# Patient Record
Sex: Male | Born: 1935
Health system: Southern US, Community
[De-identification: ages and names within clinical notes are randomized; demographics above are authoritative.]

## PROBLEM LIST (undated history)

## (undated) DIAGNOSIS — Z95828 Presence of other vascular implants and grafts: Secondary | ICD-10-CM

## (undated) DIAGNOSIS — C9110 Chronic lymphocytic leukemia of B-cell type not having achieved remission: Secondary | ICD-10-CM

## (undated) HISTORY — PX: AORTIC VALVE REPLACEMENT: SHX41

## (undated) HISTORY — DX: Chronic lymphocytic leukemia of B-cell type not having achieved remission: C91.10

## (undated) HISTORY — DX: Presence of other vascular implants and grafts: Z95.828

## (undated) HISTORY — PX: APPENDECTOMY: SHX54

---

## 2011-03-17 ENCOUNTER — Ambulatory Visit (HOSPITAL_COMMUNITY): Payer: Self-pay | Admitting: Oncology

## 2011-08-18 DIAGNOSIS — C8589 Other specified types of non-Hodgkin lymphoma, extranodal and solid organ sites: Secondary | ICD-10-CM | POA: Diagnosis not present

## 2011-08-27 DIAGNOSIS — C8589 Other specified types of non-Hodgkin lymphoma, extranodal and solid organ sites: Secondary | ICD-10-CM | POA: Diagnosis not present

## 2011-09-02 ENCOUNTER — Encounter (HOSPITAL_COMMUNITY): Payer: Medicare Other | Attending: Oncology | Admitting: Oncology

## 2011-09-02 ENCOUNTER — Encounter (HOSPITAL_COMMUNITY): Payer: Self-pay | Admitting: Oncology

## 2011-09-02 VITALS — BP 126/84 | HR 85 | Temp 96.5°F | Ht 66.0 in | Wt 180.5 lb

## 2011-09-02 DIAGNOSIS — Z809 Family history of malignant neoplasm, unspecified: Secondary | ICD-10-CM | POA: Diagnosis not present

## 2011-09-02 DIAGNOSIS — C9111 Chronic lymphocytic leukemia of B-cell type in remission: Secondary | ICD-10-CM

## 2011-09-02 DIAGNOSIS — C9110 Chronic lymphocytic leukemia of B-cell type not having achieved remission: Secondary | ICD-10-CM | POA: Insufficient documentation

## 2011-09-02 HISTORY — DX: Chronic lymphocytic leukemia of B-cell type not having achieved remission: C91.10

## 2011-09-02 NOTE — Progress Notes (Signed)
[facility] NEW PATIENT EVALUATION   Name: Jimmy Russell Date: 09/02/2011 MRN: [id number] DOB: 06-12-1920     DIAGNOSIS: CLL with Richter's Transformation, S/P 6 cycles of R-CHOP chemotherapy with a complete remission per PET criteria.    HISTORY OF PRESENT ILLNESS:Jimmy Russell is a 78 y.o. male who is referred to the [facility] by the request of Dr. Sharyne Richters at [facility] medical center to become an established patient at the [facility] for port flushes and local support.  We are certainly glad to due this for Dr. Sharyne Richters and the patient.   Jimmy Russell is an 78 year old gentleman who was diagnosed and treated at [facility] for CLL that underwent a Richter's Transformation.  He was treated with R-CHOP x 6 cycles which was completed just a few weeks ago.  His PET scan following chemotherapy revealed a complete remission.  This elderly gentleman tolerated therapy well according to his own account.   Our role is to be the patient's local support and to flush his port every three months here at the clinic (he will have it flushed at [facility] every 3 months and we will offset this routine).  Therefore, he will have his port flushed every 6 weeks alternating between the [facility] and [facility].   Sadly, the patient's son passed away a few weeks ago from metastatic bladder cancer.  This of course upsets the patient.  The patient was accompanied by his other son, Jimmy Russell, today.     FAMILY HISTORY: family history includes Cancer in his brother and son.   PAST MEDICAL HISTORY:  has a past medical history of CLL (chronic lymphoblastic leukemia).      SOCIAL HISTORY:  does not have a smoking history on file. He does not have any smokeless tobacco history on file.     ALLERGIES: Review of patient's allergies indicates no known allergies.     REVIEW OF SYSTEMS: Patient reports no health concerns.   PHYSICAL EXAM:   height is 5\' 6"  (1.676 m) and weight is 180 lb 8 oz (81.874 kg). His oral temperature is 96.5 F (35.8 C). His blood pressure is 126/84 and his pulse is 85.  General appearance: alert, cooperative, appears stated age and no distress Neck: no adenopathy, no carotid bruit, no JVD, supple, symmetrical, trachea midline and thyroid not enlarged, symmetric, no tenderness/mass/nodules Lymph nodes: Cervical, supraclavicular, and axillary nodes normal. Resp: clear to auscultation bilaterally Back: symmetric, no curvature. ROM normal. No CVA tenderness. Cardio: regular rate and rhythm, S1, S2 normal, 1-2/6 systolic ejection murmur heard best at left sternal border, no click, rub or gallop GI: soft, non-tender; bowel sounds normal; no masses,  no organomegaly Extremities: extremities normal, atraumatic, no cyanosis or edema Neurologic: Alert and oriented X 3, normal strength and tone. Normal symmetric reflexes. Normal coordination and gait     IMPRESSION:  1. CLL with a Richter's Transformation.  S/P 6 cycles of R-CHOP, tolerated well, PET scan following treatment reveals a complete remission.   PLAN:  1. Return in March for port flush 2. Return 3 months later for port flush 3. Will see the patient on a PRN basis 4. We are glad to be this patient's local support.   All questions were answered.  The patient knows to call the clinic with any questions or concerns.  Patient seen by Dr. Glenford Peers as well.  KEFALAS,THOMAS

## 2011-10-08 ENCOUNTER — Encounter (HOSPITAL_COMMUNITY): Payer: Medicare Other | Attending: Oncology

## 2011-10-08 DIAGNOSIS — C9111 Chronic lymphocytic leukemia of B-cell type in remission: Secondary | ICD-10-CM | POA: Diagnosis not present

## 2011-10-08 DIAGNOSIS — Z452 Encounter for adjustment and management of vascular access device: Secondary | ICD-10-CM

## 2011-10-08 MED ORDER — SODIUM CHLORIDE 0.9 % IJ SOLN
10.0000 mL | INTRAMUSCULAR | Status: DC | PRN
  Administered 2011-10-08: 10 mL via INTRAVENOUS
  Filled 2011-10-08: qty 10

## 2011-10-08 MED ORDER — HEPARIN SOD (PORK) LOCK FLUSH 100 UNIT/ML IV SOLN
INTRAVENOUS | Status: AC
  Administered 2011-10-08: 500 [IU] via INTRAVENOUS
  Filled 2011-10-08: qty 5

## 2011-10-08 MED ORDER — HEPARIN SOD (PORK) LOCK FLUSH 100 UNIT/ML IV SOLN
500.0000 [IU] | Freq: Once | INTRAVENOUS | Status: AC
  Administered 2011-10-08: 500 [IU] via INTRAVENOUS
  Filled 2011-10-08: qty 5

## 2011-10-08 MED ORDER — SODIUM CHLORIDE 0.9 % IJ SOLN
INTRAMUSCULAR | Status: AC
  Administered 2011-10-08: 10 mL via INTRAVENOUS
  Filled 2011-10-08: qty 10

## 2011-10-08 NOTE — Progress Notes (Signed)
Jimmy Russell presented for Portacath access and flush. Proper placement of portacath confirmed by CXR. Portacath located right chest wall and dual ports were accessed with  H 20 needle; new needle x each flush.   Good blood return present. Each portacath flushed with 20ml NS and 500U/18ml Heparin and needle removed intact. Procedure without incident. Patient tolerated procedure well.

## 2011-11-18 ENCOUNTER — Encounter (HOSPITAL_COMMUNITY): Payer: Medicare Other | Attending: Oncology

## 2011-11-18 DIAGNOSIS — Z452 Encounter for adjustment and management of vascular access device: Secondary | ICD-10-CM | POA: Diagnosis not present

## 2011-11-18 DIAGNOSIS — C9110 Chronic lymphocytic leukemia of B-cell type not having achieved remission: Secondary | ICD-10-CM | POA: Diagnosis not present

## 2011-11-18 MED ORDER — HEPARIN SOD (PORK) LOCK FLUSH 100 UNIT/ML IV SOLN
INTRAVENOUS | Status: AC
  Filled 2011-11-18: qty 10

## 2011-11-18 MED ORDER — HEPARIN SOD (PORK) LOCK FLUSH 100 UNIT/ML IV SOLN
500.0000 [IU] | Freq: Once | INTRAVENOUS | Status: AC
  Administered 2011-11-18: 500 [IU] via INTRAVENOUS
  Filled 2011-11-18: qty 5

## 2011-11-18 MED ORDER — SODIUM CHLORIDE 0.9 % IJ SOLN
10.0000 mL | INTRAMUSCULAR | Status: DC | PRN
  Administered 2011-11-18: 10 mL via INTRAVENOUS
  Filled 2011-11-18: qty 10

## 2011-11-18 MED ORDER — SODIUM CHLORIDE 0.9 % IJ SOLN
INTRAMUSCULAR | Status: AC
  Filled 2011-11-18: qty 20

## 2011-11-18 NOTE — Progress Notes (Signed)
Double port flushed bilaterally.  Good blood return both sides.

## 2011-11-19 DIAGNOSIS — I2510 Atherosclerotic heart disease of native coronary artery without angina pectoris: Secondary | ICD-10-CM | POA: Diagnosis not present

## 2011-11-19 DIAGNOSIS — N400 Enlarged prostate without lower urinary tract symptoms: Secondary | ICD-10-CM | POA: Diagnosis not present

## 2011-11-19 DIAGNOSIS — M899 Disorder of bone, unspecified: Secondary | ICD-10-CM | POA: Diagnosis not present

## 2011-11-19 DIAGNOSIS — C8583 Other specified types of non-Hodgkin lymphoma, intra-abdominal lymph nodes: Secondary | ICD-10-CM | POA: Diagnosis not present

## 2011-11-19 DIAGNOSIS — C8589 Other specified types of non-Hodgkin lymphoma, extranodal and solid organ sites: Secondary | ICD-10-CM | POA: Diagnosis not present

## 2011-11-19 DIAGNOSIS — I723 Aneurysm of iliac artery: Secondary | ICD-10-CM | POA: Diagnosis not present

## 2011-11-19 DIAGNOSIS — I700 Atherosclerosis of aorta: Secondary | ICD-10-CM | POA: Diagnosis not present

## 2011-11-26 DIAGNOSIS — C8589 Other specified types of non-Hodgkin lymphoma, extranodal and solid organ sites: Secondary | ICD-10-CM | POA: Diagnosis not present

## 2011-11-26 DIAGNOSIS — C9110 Chronic lymphocytic leukemia of B-cell type not having achieved remission: Secondary | ICD-10-CM | POA: Diagnosis not present

## 2011-12-10 DIAGNOSIS — C9110 Chronic lymphocytic leukemia of B-cell type not having achieved remission: Secondary | ICD-10-CM | POA: Diagnosis not present

## 2011-12-30 ENCOUNTER — Encounter (HOSPITAL_COMMUNITY): Payer: Medicare Other | Attending: Oncology

## 2011-12-30 DIAGNOSIS — C9110 Chronic lymphocytic leukemia of B-cell type not having achieved remission: Secondary | ICD-10-CM | POA: Diagnosis not present

## 2011-12-30 DIAGNOSIS — Z452 Encounter for adjustment and management of vascular access device: Secondary | ICD-10-CM

## 2011-12-30 MED ORDER — SODIUM CHLORIDE 0.9 % IJ SOLN
INTRAMUSCULAR | Status: AC
  Filled 2011-12-30: qty 10

## 2011-12-30 MED ORDER — SODIUM CHLORIDE 0.9 % IJ SOLN
10.0000 mL | Freq: Once | INTRAMUSCULAR | Status: AC
  Administered 2011-12-30: 10 mL via INTRAVENOUS
  Filled 2011-12-30: qty 10

## 2011-12-30 MED ORDER — HEPARIN SOD (PORK) LOCK FLUSH 100 UNIT/ML IV SOLN
500.0000 [IU] | Freq: Once | INTRAVENOUS | Status: AC
  Administered 2011-12-30: 500 [IU] via INTRAVENOUS
  Filled 2011-12-30: qty 5

## 2011-12-30 MED ORDER — HEPARIN SOD (PORK) LOCK FLUSH 100 UNIT/ML IV SOLN
INTRAVENOUS | Status: AC
  Filled 2011-12-30: qty 10

## 2011-12-30 NOTE — Progress Notes (Signed)
Jimmy Russell presented for Portacath access and flush. Proper placement of portacath confirmed by CXR. Portacath located rt chest wall accessed with  H 20 needle. Good blood return present. Portacath flushed with 20ml NS and 500U/70ml Heparin and needle removed intact. Procedure without incident. Patient tolerated procedure well.   Double port present both flushed according to above.

## 2012-02-10 ENCOUNTER — Encounter (HOSPITAL_COMMUNITY): Payer: Medicare Other | Attending: Oncology

## 2012-02-10 DIAGNOSIS — Z452 Encounter for adjustment and management of vascular access device: Secondary | ICD-10-CM

## 2012-02-10 DIAGNOSIS — C9110 Chronic lymphocytic leukemia of B-cell type not having achieved remission: Secondary | ICD-10-CM

## 2012-02-10 MED ORDER — SODIUM CHLORIDE 0.9 % IJ SOLN
10.0000 mL | INTRAMUSCULAR | Status: DC | PRN
  Administered 2012-02-10: 10 mL via INTRAVENOUS
  Filled 2012-02-10: qty 10

## 2012-02-10 MED ORDER — HEPARIN SOD (PORK) LOCK FLUSH 100 UNIT/ML IV SOLN
500.0000 [IU] | Freq: Once | INTRAVENOUS | Status: AC
  Administered 2012-02-10: 500 [IU] via INTRAVENOUS
  Filled 2012-02-10: qty 5

## 2012-02-10 NOTE — Progress Notes (Signed)
Double port flushed per protocol with ns 20 ml and heparin 500 units to each side of port.

## 2012-02-22 DIAGNOSIS — C8589 Other specified types of non-Hodgkin lymphoma, extranodal and solid organ sites: Secondary | ICD-10-CM | POA: Diagnosis not present

## 2012-02-25 DIAGNOSIS — C8589 Other specified types of non-Hodgkin lymphoma, extranodal and solid organ sites: Secondary | ICD-10-CM | POA: Diagnosis not present

## 2012-03-23 ENCOUNTER — Encounter (HOSPITAL_COMMUNITY): Payer: Medicare Other | Attending: Oncology

## 2012-03-23 DIAGNOSIS — Z452 Encounter for adjustment and management of vascular access device: Secondary | ICD-10-CM

## 2012-03-23 DIAGNOSIS — C9110 Chronic lymphocytic leukemia of B-cell type not having achieved remission: Secondary | ICD-10-CM

## 2012-03-23 MED ORDER — HEPARIN SOD (PORK) LOCK FLUSH 100 UNIT/ML IV SOLN
500.0000 [IU] | Freq: Once | INTRAVENOUS | Status: AC
  Administered 2012-03-23: 500 [IU] via INTRAVENOUS
  Filled 2012-03-23: qty 5

## 2012-03-23 MED ORDER — SODIUM CHLORIDE 0.9 % IJ SOLN
10.0000 mL | INTRAMUSCULAR | Status: DC | PRN
  Administered 2012-03-23: 10 mL via INTRAVENOUS
  Filled 2012-03-23: qty 10

## 2012-03-23 MED ORDER — SODIUM CHLORIDE 0.9 % IJ SOLN
INTRAMUSCULAR | Status: AC
  Filled 2012-03-23: qty 10

## 2012-03-23 MED ORDER — HEPARIN SOD (PORK) LOCK FLUSH 100 UNIT/ML IV SOLN
INTRAVENOUS | Status: AC
  Filled 2012-03-23: qty 5

## 2012-03-23 NOTE — Progress Notes (Signed)
Pt has double port.  Both sides flushed without difficulty.  Good blood return from  both sides of port.

## 2012-05-04 ENCOUNTER — Encounter (HOSPITAL_COMMUNITY): Payer: Medicare Other | Attending: Oncology

## 2012-05-04 DIAGNOSIS — Z452 Encounter for adjustment and management of vascular access device: Secondary | ICD-10-CM

## 2012-05-04 DIAGNOSIS — C9110 Chronic lymphocytic leukemia of B-cell type not having achieved remission: Secondary | ICD-10-CM | POA: Insufficient documentation

## 2012-05-04 MED ORDER — SODIUM CHLORIDE 0.9 % IJ SOLN
10.0000 mL | INTRAMUSCULAR | Status: DC | PRN
  Administered 2012-05-04: 10 mL via INTRAVENOUS
  Filled 2012-05-04: qty 10

## 2012-05-04 MED ORDER — HEPARIN SOD (PORK) LOCK FLUSH 100 UNIT/ML IV SOLN
INTRAVENOUS | Status: AC
  Filled 2012-05-04: qty 10

## 2012-05-04 MED ORDER — HEPARIN SOD (PORK) LOCK FLUSH 100 UNIT/ML IV SOLN
500.0000 [IU] | Freq: Once | INTRAVENOUS | Status: AC
  Administered 2012-05-04: 500 [IU] via INTRAVENOUS
  Filled 2012-05-04: qty 5

## 2012-05-04 MED ORDER — SODIUM CHLORIDE 0.9 % IJ SOLN
INTRAMUSCULAR | Status: AC
  Filled 2012-05-04: qty 20

## 2012-05-04 NOTE — Progress Notes (Signed)
Tolerated port flush well.  Good blood return. Has dual port. Both sides flushed.

## 2012-05-23 DIAGNOSIS — N400 Enlarged prostate without lower urinary tract symptoms: Secondary | ICD-10-CM | POA: Diagnosis not present

## 2012-05-23 DIAGNOSIS — C8589 Other specified types of non-Hodgkin lymphoma, extranodal and solid organ sites: Secondary | ICD-10-CM | POA: Diagnosis not present

## 2012-05-26 DIAGNOSIS — C8589 Other specified types of non-Hodgkin lymphoma, extranodal and solid organ sites: Secondary | ICD-10-CM | POA: Diagnosis not present

## 2012-05-26 DIAGNOSIS — Z23 Encounter for immunization: Secondary | ICD-10-CM | POA: Diagnosis not present

## 2012-06-15 ENCOUNTER — Encounter (HOSPITAL_COMMUNITY): Payer: Medicare Other | Attending: Oncology

## 2012-06-15 DIAGNOSIS — Z452 Encounter for adjustment and management of vascular access device: Secondary | ICD-10-CM | POA: Diagnosis not present

## 2012-06-15 DIAGNOSIS — C9110 Chronic lymphocytic leukemia of B-cell type not having achieved remission: Secondary | ICD-10-CM | POA: Diagnosis not present

## 2012-06-15 MED ORDER — HEPARIN SOD (PORK) LOCK FLUSH 100 UNIT/ML IV SOLN
500.0000 [IU] | Freq: Once | INTRAVENOUS | Status: AC
  Administered 2012-06-15: 500 [IU] via INTRAVENOUS
  Filled 2012-06-15: qty 5

## 2012-06-15 MED ORDER — HEPARIN SOD (PORK) LOCK FLUSH 100 UNIT/ML IV SOLN
INTRAVENOUS | Status: AC
  Filled 2012-06-15: qty 10

## 2012-06-15 MED ORDER — SODIUM CHLORIDE 0.9 % IJ SOLN
10.0000 mL | INTRAMUSCULAR | Status: DC | PRN
  Administered 2012-06-15: 10 mL via INTRAVENOUS
  Filled 2012-06-15: qty 10

## 2012-06-15 MED ORDER — SODIUM CHLORIDE 0.9 % IJ SOLN
INTRAMUSCULAR | Status: AC
  Filled 2012-06-15: qty 20

## 2012-06-15 NOTE — Progress Notes (Signed)
Tolerated port flush.  Has double port.  Each side flushed with ns and Heparin.

## 2012-08-01 ENCOUNTER — Encounter (HOSPITAL_COMMUNITY): Payer: Medicare Other | Attending: Oncology

## 2012-08-01 DIAGNOSIS — C9110 Chronic lymphocytic leukemia of B-cell type not having achieved remission: Secondary | ICD-10-CM

## 2012-08-01 DIAGNOSIS — Z452 Encounter for adjustment and management of vascular access device: Secondary | ICD-10-CM

## 2012-08-01 MED ORDER — SODIUM CHLORIDE 0.9 % IJ SOLN
INTRAMUSCULAR | Status: AC
  Filled 2012-08-01: qty 20

## 2012-08-01 MED ORDER — SODIUM CHLORIDE 0.9 % IJ SOLN
10.0000 mL | Freq: Once | INTRAMUSCULAR | Status: AC
  Administered 2012-08-01: 10 mL via INTRAVENOUS
  Filled 2012-08-01: qty 10

## 2012-08-01 MED ORDER — HEPARIN SOD (PORK) LOCK FLUSH 100 UNIT/ML IV SOLN
500.0000 [IU] | Freq: Once | INTRAVENOUS | Status: AC
  Administered 2012-08-01: 500 [IU] via INTRAVENOUS
  Filled 2012-08-01: qty 5

## 2012-08-01 MED ORDER — SODIUM CHLORIDE 0.9 % IJ SOLN
10.0000 mL | INTRAMUSCULAR | Status: DC | PRN
  Administered 2012-08-01: 10 mL via INTRAVENOUS
  Filled 2012-08-01: qty 10

## 2012-08-01 MED ORDER — HEPARIN SOD (PORK) LOCK FLUSH 100 UNIT/ML IV SOLN
INTRAVENOUS | Status: AC
  Filled 2012-08-01: qty 10

## 2012-08-01 NOTE — Progress Notes (Signed)
Jimmy Russell presented for Portacath access and flush. Proper placement of portacath confirmed by CXR. Portacath located rt chest wall accessed with  H 20 needle x 2 for dual port Good blood return present. Portacath flushed with 20ml NS and 500U/41ml Heparin and needle removed intact. Procedure without incident. Patient tolerated procedure well.

## 2012-08-23 DIAGNOSIS — I2510 Atherosclerotic heart disease of native coronary artery without angina pectoris: Secondary | ICD-10-CM | POA: Diagnosis not present

## 2012-08-23 DIAGNOSIS — C8589 Other specified types of non-Hodgkin lymphoma, extranodal and solid organ sites: Secondary | ICD-10-CM | POA: Diagnosis not present

## 2012-08-25 DIAGNOSIS — C8589 Other specified types of non-Hodgkin lymphoma, extranodal and solid organ sites: Secondary | ICD-10-CM | POA: Diagnosis not present

## 2012-08-25 DIAGNOSIS — C9110 Chronic lymphocytic leukemia of B-cell type not having achieved remission: Secondary | ICD-10-CM | POA: Diagnosis not present

## 2012-09-12 ENCOUNTER — Encounter (HOSPITAL_COMMUNITY): Payer: Medicare Other | Attending: Oncology

## 2012-09-12 ENCOUNTER — Encounter (HOSPITAL_COMMUNITY): Payer: Self-pay

## 2012-09-12 DIAGNOSIS — C9110 Chronic lymphocytic leukemia of B-cell type not having achieved remission: Secondary | ICD-10-CM

## 2012-09-12 DIAGNOSIS — Z452 Encounter for adjustment and management of vascular access device: Secondary | ICD-10-CM | POA: Diagnosis not present

## 2012-09-12 DIAGNOSIS — Z95828 Presence of other vascular implants and grafts: Secondary | ICD-10-CM

## 2012-09-12 DIAGNOSIS — Z9889 Other specified postprocedural states: Secondary | ICD-10-CM | POA: Insufficient documentation

## 2012-09-12 HISTORY — DX: Presence of other vascular implants and grafts: Z95.828

## 2012-09-12 MED ORDER — HEPARIN SOD (PORK) LOCK FLUSH 100 UNIT/ML IV SOLN
500.0000 [IU] | Freq: Once | INTRAVENOUS | Status: AC
  Administered 2012-09-12: 500 [IU] via INTRAVENOUS
  Filled 2012-09-12: qty 5

## 2012-09-12 MED ORDER — HEPARIN SOD (PORK) LOCK FLUSH 100 UNIT/ML IV SOLN
INTRAVENOUS | Status: AC
  Filled 2012-09-12: qty 10

## 2012-09-12 MED ORDER — SODIUM CHLORIDE 0.9 % IJ SOLN
10.0000 mL | INTRAMUSCULAR | Status: DC | PRN
  Administered 2012-09-12: 10 mL via INTRAVENOUS
  Filled 2012-09-12: qty 10

## 2012-09-12 NOTE — Progress Notes (Signed)
Juline Patch presented for Portacath access and flush. Proper placement of portacath confirmed by CXR. Portacath located right chest wall accessed with  H 20 needle. Good blood return present. Portacath flushed with 20ml NS and 500U/32ml Heparin and needle removed intact. Procedure without incident. Patient tolerated procedure well.   pt has a double port.  Above same for both left and right sides.

## 2012-09-21 DIAGNOSIS — L259 Unspecified contact dermatitis, unspecified cause: Secondary | ICD-10-CM | POA: Diagnosis not present

## 2012-09-21 DIAGNOSIS — C44319 Basal cell carcinoma of skin of other parts of face: Secondary | ICD-10-CM | POA: Diagnosis not present

## 2012-09-21 DIAGNOSIS — D485 Neoplasm of uncertain behavior of skin: Secondary | ICD-10-CM | POA: Diagnosis not present

## 2012-09-21 DIAGNOSIS — L570 Actinic keratosis: Secondary | ICD-10-CM | POA: Diagnosis not present

## 2012-09-21 DIAGNOSIS — D234 Other benign neoplasm of skin of scalp and neck: Secondary | ICD-10-CM | POA: Diagnosis not present

## 2012-10-20 DIAGNOSIS — D485 Neoplasm of uncertain behavior of skin: Secondary | ICD-10-CM | POA: Diagnosis not present

## 2012-10-20 DIAGNOSIS — Z85828 Personal history of other malignant neoplasm of skin: Secondary | ICD-10-CM | POA: Diagnosis not present

## 2012-10-24 ENCOUNTER — Encounter (HOSPITAL_COMMUNITY): Payer: Medicare Other | Attending: Oncology

## 2012-10-24 DIAGNOSIS — C9110 Chronic lymphocytic leukemia of B-cell type not having achieved remission: Secondary | ICD-10-CM | POA: Diagnosis not present

## 2012-10-24 DIAGNOSIS — Z452 Encounter for adjustment and management of vascular access device: Secondary | ICD-10-CM | POA: Diagnosis not present

## 2012-10-24 DIAGNOSIS — Z95828 Presence of other vascular implants and grafts: Secondary | ICD-10-CM

## 2012-10-24 DIAGNOSIS — Z9889 Other specified postprocedural states: Secondary | ICD-10-CM | POA: Insufficient documentation

## 2012-10-24 MED ORDER — HEPARIN SOD (PORK) LOCK FLUSH 100 UNIT/ML IV SOLN
INTRAVENOUS | Status: AC
  Filled 2012-10-24: qty 10

## 2012-10-24 MED ORDER — HEPARIN SOD (PORK) LOCK FLUSH 100 UNIT/ML IV SOLN
500.0000 [IU] | INTRAVENOUS | Status: DC | PRN
  Administered 2012-10-24 (×2): 500 [IU] via INTRAVENOUS
  Filled 2012-10-24: qty 5

## 2012-10-24 MED ORDER — SODIUM CHLORIDE 0.9 % IJ SOLN
20.0000 mL | INTRAMUSCULAR | Status: DC | PRN
  Administered 2012-10-24 (×2): 20 mL via INTRAVENOUS
  Filled 2012-10-24: qty 20

## 2012-10-24 NOTE — Progress Notes (Signed)
Juline Patch presented for Portacath access and flush. Proper placement of portacath confirmed by CXR. Portacath located right chest wall accessed with  H 20 needles, patient has double port and both sides were flushed. No blood return either side but flushes well without pain or discomfort.   Portacath flushed with 20ml NS and 500U/45ml Heparin and needle removed intact on both sides. Procedure without incident. Patient tolerated procedure well.

## 2012-11-22 DIAGNOSIS — C9110 Chronic lymphocytic leukemia of B-cell type not having achieved remission: Secondary | ICD-10-CM | POA: Diagnosis not present

## 2012-11-22 DIAGNOSIS — C8589 Other specified types of non-Hodgkin lymphoma, extranodal and solid organ sites: Secondary | ICD-10-CM | POA: Diagnosis not present

## 2012-11-24 DIAGNOSIS — C8589 Other specified types of non-Hodgkin lymphoma, extranodal and solid organ sites: Secondary | ICD-10-CM | POA: Diagnosis not present

## 2012-11-24 DIAGNOSIS — C9110 Chronic lymphocytic leukemia of B-cell type not having achieved remission: Secondary | ICD-10-CM | POA: Diagnosis not present

## 2012-12-05 ENCOUNTER — Encounter (HOSPITAL_COMMUNITY): Payer: Medicare Other | Attending: Oncology

## 2012-12-05 DIAGNOSIS — Z95828 Presence of other vascular implants and grafts: Secondary | ICD-10-CM

## 2012-12-05 DIAGNOSIS — C9110 Chronic lymphocytic leukemia of B-cell type not having achieved remission: Secondary | ICD-10-CM | POA: Diagnosis not present

## 2012-12-05 DIAGNOSIS — Z452 Encounter for adjustment and management of vascular access device: Secondary | ICD-10-CM

## 2012-12-05 MED ORDER — HEPARIN SOD (PORK) LOCK FLUSH 100 UNIT/ML IV SOLN
500.0000 [IU] | Freq: Once | INTRAVENOUS | Status: AC
  Administered 2012-12-05: 1000 [IU] via INTRAVENOUS
  Filled 2012-12-05: qty 5

## 2012-12-05 MED ORDER — SODIUM CHLORIDE 0.9 % IJ SOLN
10.0000 mL | INTRAMUSCULAR | Status: DC | PRN
  Administered 2012-12-05: 20 mL via INTRAVENOUS
  Filled 2012-12-05: qty 10

## 2012-12-05 MED ORDER — HEPARIN SOD (PORK) LOCK FLUSH 100 UNIT/ML IV SOLN
INTRAVENOUS | Status: AC
  Filled 2012-12-05: qty 10

## 2012-12-05 NOTE — Progress Notes (Signed)
Jimmy Russell presented for Portacath access and flush. Proper placement of portacath confirmed by CXR. Portacath located right  chest wall accessed with  H 20 needle x 2. Good blood return present x 2 lumens. Portacath flushed with 20ml NS and 500U/40ml Heparin and needle removed intact. Process repeated x1 due to double port. Procedure without incident. Patient tolerated procedure well.

## 2013-01-16 ENCOUNTER — Encounter (HOSPITAL_COMMUNITY): Payer: Medicare Other | Attending: Oncology

## 2013-01-16 DIAGNOSIS — C9110 Chronic lymphocytic leukemia of B-cell type not having achieved remission: Secondary | ICD-10-CM | POA: Diagnosis not present

## 2013-01-16 DIAGNOSIS — Z452 Encounter for adjustment and management of vascular access device: Secondary | ICD-10-CM | POA: Diagnosis not present

## 2013-01-16 DIAGNOSIS — Z95828 Presence of other vascular implants and grafts: Secondary | ICD-10-CM

## 2013-01-16 DIAGNOSIS — Z9889 Other specified postprocedural states: Secondary | ICD-10-CM | POA: Insufficient documentation

## 2013-01-16 MED ORDER — SODIUM CHLORIDE 0.9 % IJ SOLN
20.0000 mL | INTRAMUSCULAR | Status: DC | PRN
  Administered 2013-01-16 (×2): 20 mL via INTRAVENOUS
  Filled 2013-01-16: qty 20

## 2013-01-16 MED ORDER — HEPARIN SOD (PORK) LOCK FLUSH 100 UNIT/ML IV SOLN
INTRAVENOUS | Status: AC
  Filled 2013-01-16: qty 10

## 2013-01-16 MED ORDER — HEPARIN SOD (PORK) LOCK FLUSH 100 UNIT/ML IV SOLN
500.0000 [IU] | Freq: Once | INTRAVENOUS | Status: AC
  Administered 2013-01-16: 500 [IU] via INTRAVENOUS
  Filled 2013-01-16: qty 5

## 2013-01-16 NOTE — Progress Notes (Signed)
Jimmy Russell presented for Portacath access and flush. Proper placement of portacath confirmed by CXR. Portacath located right chest wall accessed with  H 20 needle. Good blood return present, both sides.   Portacath flushed with 20ml NS and 500U/31ml Heparin and needle removed intact. Procedure without incident. Patient tolerated procedure well.

## 2013-01-26 DIAGNOSIS — Z85828 Personal history of other malignant neoplasm of skin: Secondary | ICD-10-CM | POA: Diagnosis not present

## 2013-01-26 DIAGNOSIS — L821 Other seborrheic keratosis: Secondary | ICD-10-CM | POA: Diagnosis not present

## 2013-02-21 DIAGNOSIS — C8589 Other specified types of non-Hodgkin lymphoma, extranodal and solid organ sites: Secondary | ICD-10-CM | POA: Diagnosis not present

## 2013-02-21 DIAGNOSIS — C9110 Chronic lymphocytic leukemia of B-cell type not having achieved remission: Secondary | ICD-10-CM | POA: Diagnosis not present

## 2013-02-23 DIAGNOSIS — C8589 Other specified types of non-Hodgkin lymphoma, extranodal and solid organ sites: Secondary | ICD-10-CM | POA: Diagnosis not present

## 2013-02-23 DIAGNOSIS — C9110 Chronic lymphocytic leukemia of B-cell type not having achieved remission: Secondary | ICD-10-CM | POA: Diagnosis not present

## 2013-02-27 ENCOUNTER — Encounter (HOSPITAL_COMMUNITY): Payer: Medicare Other

## 2013-02-28 DIAGNOSIS — C9110 Chronic lymphocytic leukemia of B-cell type not having achieved remission: Secondary | ICD-10-CM | POA: Diagnosis not present

## 2013-02-28 DIAGNOSIS — C8589 Other specified types of non-Hodgkin lymphoma, extranodal and solid organ sites: Secondary | ICD-10-CM | POA: Diagnosis not present

## 2013-04-06 DIAGNOSIS — I82409 Acute embolism and thrombosis of unspecified deep veins of unspecified lower extremity: Secondary | ICD-10-CM | POA: Diagnosis not present

## 2013-04-06 DIAGNOSIS — C8589 Other specified types of non-Hodgkin lymphoma, extranodal and solid organ sites: Secondary | ICD-10-CM | POA: Diagnosis not present

## 2013-05-23 DIAGNOSIS — C9110 Chronic lymphocytic leukemia of B-cell type not having achieved remission: Secondary | ICD-10-CM | POA: Diagnosis not present

## 2013-05-23 DIAGNOSIS — C8589 Other specified types of non-Hodgkin lymphoma, extranodal and solid organ sites: Secondary | ICD-10-CM | POA: Diagnosis not present

## 2013-05-25 DIAGNOSIS — C8589 Other specified types of non-Hodgkin lymphoma, extranodal and solid organ sites: Secondary | ICD-10-CM | POA: Diagnosis not present

## 2013-05-25 DIAGNOSIS — C9110 Chronic lymphocytic leukemia of B-cell type not having achieved remission: Secondary | ICD-10-CM | POA: Diagnosis not present

## 2013-05-25 DIAGNOSIS — Z23 Encounter for immunization: Secondary | ICD-10-CM | POA: Diagnosis not present

## 2013-06-09 DIAGNOSIS — J210 Acute bronchiolitis due to respiratory syncytial virus: Secondary | ICD-10-CM | POA: Diagnosis not present

## 2013-06-09 DIAGNOSIS — J392 Other diseases of pharynx: Secondary | ICD-10-CM | POA: Diagnosis not present

## 2013-06-14 ENCOUNTER — Telehealth (HOSPITAL_COMMUNITY): Payer: Self-pay | Admitting: Oncology

## 2013-06-14 NOTE — Telephone Encounter (Signed)
PC TO PT ADVISING HIM THAT THE BILL WAS AN ERROR AND IT HAS BEEN ADJUSTED OFF OF HIS ACCOUNT

## 2013-08-07 DIAGNOSIS — L820 Inflamed seborrheic keratosis: Secondary | ICD-10-CM | POA: Diagnosis not present

## 2013-08-07 DIAGNOSIS — D235 Other benign neoplasm of skin of trunk: Secondary | ICD-10-CM | POA: Diagnosis not present

## 2013-08-07 DIAGNOSIS — L570 Actinic keratosis: Secondary | ICD-10-CM | POA: Diagnosis not present

## 2013-08-07 DIAGNOSIS — L821 Other seborrheic keratosis: Secondary | ICD-10-CM | POA: Diagnosis not present

## 2013-08-21 DIAGNOSIS — L820 Inflamed seborrheic keratosis: Secondary | ICD-10-CM | POA: Diagnosis not present

## 2013-08-21 DIAGNOSIS — C44319 Basal cell carcinoma of skin of other parts of face: Secondary | ICD-10-CM | POA: Diagnosis not present

## 2013-09-07 DIAGNOSIS — C9110 Chronic lymphocytic leukemia of B-cell type not having achieved remission: Secondary | ICD-10-CM | POA: Diagnosis not present

## 2013-09-07 DIAGNOSIS — C8589 Other specified types of non-Hodgkin lymphoma, extranodal and solid organ sites: Secondary | ICD-10-CM | POA: Diagnosis not present

## 2013-10-26 DIAGNOSIS — L820 Inflamed seborrheic keratosis: Secondary | ICD-10-CM | POA: Diagnosis not present

## 2013-10-26 DIAGNOSIS — L570 Actinic keratosis: Secondary | ICD-10-CM | POA: Diagnosis not present

## 2013-12-07 DIAGNOSIS — L538 Other specified erythematous conditions: Secondary | ICD-10-CM | POA: Diagnosis not present

## 2013-12-07 DIAGNOSIS — L821 Other seborrheic keratosis: Secondary | ICD-10-CM | POA: Diagnosis not present

## 2013-12-07 DIAGNOSIS — L570 Actinic keratosis: Secondary | ICD-10-CM | POA: Diagnosis not present

## 2014-01-11 DIAGNOSIS — L570 Actinic keratosis: Secondary | ICD-10-CM | POA: Diagnosis not present

## 2014-03-08 DIAGNOSIS — C9110 Chronic lymphocytic leukemia of B-cell type not having achieved remission: Secondary | ICD-10-CM | POA: Diagnosis not present

## 2014-03-08 DIAGNOSIS — C8589 Other specified types of non-Hodgkin lymphoma, extranodal and solid organ sites: Secondary | ICD-10-CM | POA: Diagnosis not present

## 2014-04-26 DIAGNOSIS — I781 Nevus, non-neoplastic: Secondary | ICD-10-CM | POA: Diagnosis not present

## 2014-04-26 DIAGNOSIS — L905 Scar conditions and fibrosis of skin: Secondary | ICD-10-CM | POA: Diagnosis not present

## 2014-04-26 DIAGNOSIS — L820 Inflamed seborrheic keratosis: Secondary | ICD-10-CM | POA: Diagnosis not present

## 2014-04-26 DIAGNOSIS — Z85828 Personal history of other malignant neoplasm of skin: Secondary | ICD-10-CM | POA: Diagnosis not present

## 2014-05-07 DIAGNOSIS — Z23 Encounter for immunization: Secondary | ICD-10-CM | POA: Diagnosis not present

## 2014-05-23 DIAGNOSIS — R197 Diarrhea, unspecified: Secondary | ICD-10-CM | POA: Diagnosis not present

## 2014-05-23 DIAGNOSIS — Z79899 Other long term (current) drug therapy: Secondary | ICD-10-CM | POA: Diagnosis not present

## 2014-05-29 DIAGNOSIS — K591 Functional diarrhea: Secondary | ICD-10-CM | POA: Diagnosis not present

## 2014-05-29 DIAGNOSIS — R197 Diarrhea, unspecified: Secondary | ICD-10-CM | POA: Diagnosis not present

## 2014-05-29 DIAGNOSIS — R7301 Impaired fasting glucose: Secondary | ICD-10-CM | POA: Diagnosis not present

## 2014-09-06 DIAGNOSIS — R634 Abnormal weight loss: Secondary | ICD-10-CM | POA: Diagnosis not present

## 2014-09-06 DIAGNOSIS — R160 Hepatomegaly, not elsewhere classified: Secondary | ICD-10-CM | POA: Diagnosis not present

## 2014-09-06 DIAGNOSIS — Z8572 Personal history of non-Hodgkin lymphomas: Secondary | ICD-10-CM | POA: Diagnosis not present

## 2014-09-06 DIAGNOSIS — C8590 Non-Hodgkin lymphoma, unspecified, unspecified site: Secondary | ICD-10-CM | POA: Diagnosis not present

## 2014-09-06 DIAGNOSIS — C9110 Chronic lymphocytic leukemia of B-cell type not having achieved remission: Secondary | ICD-10-CM | POA: Diagnosis not present

## 2014-11-08 ENCOUNTER — Emergency Department (HOSPITAL_COMMUNITY)
Admission: EM | Admit: 2014-11-08 | Discharge: 2014-11-08 | Disposition: A | Discharge status: Home / Self Care | Payer: Medicare Other | Attending: Emergency Medicine | Admitting: Emergency Medicine

## 2014-11-08 ENCOUNTER — Encounter (HOSPITAL_COMMUNITY): Payer: Self-pay | Admitting: Emergency Medicine

## 2014-11-08 ENCOUNTER — Other Ambulatory Visit: Payer: Self-pay

## 2014-11-08 ENCOUNTER — Emergency Department (HOSPITAL_COMMUNITY): Payer: Medicare Other

## 2014-11-08 DIAGNOSIS — R002 Palpitations: Secondary | ICD-10-CM | POA: Diagnosis not present

## 2014-11-08 DIAGNOSIS — R079 Chest pain, unspecified: Secondary | ICD-10-CM | POA: Diagnosis present

## 2014-11-08 DIAGNOSIS — Z7982 Long term (current) use of aspirin: Secondary | ICD-10-CM | POA: Insufficient documentation

## 2014-11-08 DIAGNOSIS — Z79899 Other long term (current) drug therapy: Secondary | ICD-10-CM | POA: Insufficient documentation

## 2014-11-08 DIAGNOSIS — I440 Atrioventricular block, first degree: Secondary | ICD-10-CM | POA: Insufficient documentation

## 2014-11-08 DIAGNOSIS — Z87891 Personal history of nicotine dependence: Secondary | ICD-10-CM | POA: Insufficient documentation

## 2014-11-08 DIAGNOSIS — J449 Chronic obstructive pulmonary disease, unspecified: Secondary | ICD-10-CM | POA: Diagnosis not present

## 2014-11-08 LAB — CBC WITH DIFFERENTIAL/PLATELET
Basophils Absolute: 0 10*3/uL (ref 0.0–0.1)
Basophils Relative: 0 % (ref 0–1)
EOS ABS: 0.1 10*3/uL (ref 0.0–0.7)
EOS PCT: 2 % (ref 0–5)
HCT: 48.6 % (ref 39.0–52.0)
HEMOGLOBIN: 16.8 g/dL (ref 13.0–17.0)
Lymphocytes Relative: 21 % (ref 12–46)
Lymphs Abs: 1.7 10*3/uL (ref 0.7–4.0)
MCH: 31.5 pg (ref 26.0–34.0)
MCHC: 34.6 g/dL (ref 30.0–36.0)
MCV: 91 fL (ref 78.0–100.0)
MONOS PCT: 9 % (ref 3–12)
Monocytes Absolute: 0.7 10*3/uL (ref 0.1–1.0)
Neutro Abs: 5.7 10*3/uL (ref 1.7–7.7)
Neutrophils Relative %: 68 % (ref 43–77)
PLATELETS: 127 10*3/uL — AB (ref 150–400)
RBC: 5.34 MIL/uL (ref 4.22–5.81)
RDW: 13.5 % (ref 11.5–15.5)
WBC: 8.3 10*3/uL (ref 4.0–10.5)

## 2014-11-08 LAB — BASIC METABOLIC PANEL
Anion gap: 9 (ref 5–15)
BUN: 14 mg/dL (ref 6–23)
CHLORIDE: 102 mmol/L (ref 96–112)
CO2: 29 mmol/L (ref 19–32)
Calcium: 9.4 mg/dL (ref 8.4–10.5)
Creatinine, Ser: 0.84 mg/dL (ref 0.50–1.35)
GFR calc Af Amer: 85 mL/min — ABNORMAL LOW (ref >90)
GFR, EST NON AFRICAN AMERICAN: 74 mL/min — AB (ref >90)
Glucose, Bld: 120 mg/dL — ABNORMAL HIGH (ref 70–99)
Potassium: 4.6 mmol/L (ref 3.5–5.1)
SODIUM: 140 mmol/L (ref 135–145)

## 2014-11-08 LAB — URINALYSIS, ROUTINE W REFLEX MICROSCOPIC
BILIRUBIN URINE: NEGATIVE
Glucose, UA: NEGATIVE mg/dL
Hgb urine dipstick: NEGATIVE
KETONES UR: NEGATIVE mg/dL
Leukocytes, UA: NEGATIVE
Nitrite: NEGATIVE
Protein, ur: NEGATIVE mg/dL
Specific Gravity, Urine: 1.015 (ref 1.005–1.030)
UROBILINOGEN UA: 0.2 mg/dL (ref 0.0–1.0)
pH: 6 (ref 5.0–8.0)

## 2014-11-08 LAB — TROPONIN I: Troponin I: <0.03 ng/mL (ref <0.031)

## 2014-11-08 LAB — TSH: TSH: 8.003 u[IU]/mL — ABNORMAL HIGH (ref 0.350–4.500)

## 2014-11-08 LAB — MAGNESIUM: Magnesium: 1.9 mg/dL (ref 1.5–2.5)

## 2014-11-08 NOTE — ED Notes (Signed)
Having pain to left chest since last night.  Non-radiating.  Did c/o lightheadedness.  Does not c/o pain at this time.  Had heart surgery and 2006 in [facility], value replacement.

## 2014-11-08 NOTE — ED Notes (Signed)
EKG completed, given to Dr. Lacinda Axon

## 2014-11-08 NOTE — ED Provider Notes (Addendum)
TIME SEEN: 3:40 PM  CHIEF COMPLAINT: Palpitations  HPI: Pt is a 81 y.o. male with history of CLL who is in remission, aortic valve replacement in September 2006 with a bovine valve who is not on anticoagulation who presents to the emergency department with complaints of feeling a "quivering" in the left side of his chest that started last night intermittently. Family reports he has had some dizziness the patient denies this. No syncopal episodes. Denies any chest pain. No shortness of breath. No nausea, vomiting or diarrhea. No bloody stool or melena. Family states they were concerned because he has not had cardiology follow-up for his aortic valve. They're requesting referral to cardiology. Primary care physician is Dr. Nevada Crane. Denies history of PE or DVT. No lower extremity swelling or pain. No history of recent surgery, fracture, trauma, hospitalization. Patient reports that he is able to walk vigorously 35 minutes a day every other day without ever becoming symptomatic.  ROS: See HPI Constitutional: no fever  Eyes: no drainage  ENT: no runny nose   Cardiovascular:  no chest pain  Resp: no SOB  GI: no vomiting GU: no dysuria Integumentary: no rash  Allergy: no hives  Musculoskeletal: no leg swelling  Neurological: no slurred speech ROS otherwise negative  PAST MEDICAL HISTORY/PAST SURGICAL HISTORY:  Past Medical History  Diagnosis Date  . CLL (chronic lymphoblastic leukemia)   . CLL (chronic lymphocytic leukemia) with Richter's Transformation  09/02/2011  . Port catheter in place 09/12/2012    MEDICATIONS:  Prior to Admission medications   Medication Sig Start Date End Date Taking? Authorizing Provider  aspirin 81 MG tablet Take 160 mg by mouth daily.    Historical Provider, MD  beta carotene w/minerals (OCUVITE) tablet Take 1 tablet by mouth daily.    Historical Provider, MD  sulfamethoxazole-trimethoprim (BACTRIM DS) 800-160 MG per tablet Take 1 tablet by mouth 3 (three) times a  week. One tablet on Mon-Wed-Fri    Historical Provider, MD    ALLERGIES:  No Known Allergies  SOCIAL HISTORY:  History  Substance Use Topics  . Smoking status: Former Research scientist (life sciences)  . Smokeless tobacco: Not on file  . Alcohol Use: Not on file    FAMILY HISTORY: Family History  Problem Relation Age of Onset  . Cancer Brother     prostate cancer  . Cancer Son     bladder cancer    EXAM: BP 137/85 mmHg  Pulse 89  Temp(Src) 97.7 F (36.5 C) (Oral)  Ht 5\' 8"  (1.727 m)  Wt 174 lb (78.926 kg)  BMI 26.46 kg/m2  SpO2 99% CONSTITUTIONAL: Alert and oriented and responds appropriately to questions. Well-appearing; well-nourished HEAD: Normocephalic EYES: Conjunctivae clear, PERRL ENT: normal nose; no rhinorrhea; moist mucous membranes; pharynx without lesions noted NECK: Supple, no meningismus, no LAD  CARD: RRR; S1 and S2 appreciated; no murmurs, no clicks, no rubs, no gallops RESP: Normal chest excursion without splinting or tachypnea; breath sounds clear and equal bilaterally; no wheezes, no rhonchi, no rales, no hypoxia or respiratory distress, speaking full sentences ABD/GI: Normal bowel sounds; non-distended; soft, non-tender, no rebound, no guarding BACK:  The back appears normal and is non-tender to palpation, there is no CVA tenderness EXT: Normal ROM in all joints; non-tender to palpation; no edema; normal capillary refill; no cyanosis    SKIN: Normal color for age and race; warm NEURO: Moves all extremities equally PSYCH: The patient's mood and manner are appropriate. Grooming and personal hygiene are appropriate.  MEDICAL DECISION MAKING: Patient  here with palpitations that are now gone. EKG initially appeared to be accelerated junctional rhythm but no P waves are appreciated. Suspect this is because of his low voltage on EKG. Repeat EKG shows sinus rhythm with first-degree AV block. No other ischemic abnormality or interval changes. Electrolytes normal. Troponin negative. His  TSH is elevated at 8.003. We'll send for free T3 and T4 and have him follow-up with his primary care physician for this. We'll give referral information for both [facility] and regional cardiology. Have discussed with patient and family return precautions. They verbalize understanding and are comfortable with plan.   Family reports patient has an appointment to see Dr. Nevada Crane on April 19.    EKG Interpretation  Date/Time:  Thursday November 08 2014 14:44:23 EDT Ventricular Rate:  91 PR Interval:    QRS Duration: 78 QT Interval:  358 QTC Calculation: 440 R Axis:   11 Text Interpretation:  Accelerated Junctional rhythm Low voltage QRS Abnormal ECG Baseline wander in V1 No old tracing to compare Confirmed by WARD,  DO, KRISTEN [phone]) on 11/08/2014 3:17:33 PM         EKG Interpretation  Date/Time:  Thursday November 08 2014 18:41:59 EDT Ventricular Rate:  77 PR Interval:  385 QRS Duration: 90 QT Interval:  382 QTC Calculation: 432 R Axis:   0 Text Interpretation:  Age not entered, assumed to be  81 years old for purpose of ECG interpretation Sinus rhythm First degree A-V block Low voltage, precordial leads Confirmed by WARD,  DO, KRISTEN [phone]) on 11/08/2014 6:49:17 PM         Delice Bison Ward, DO 11/08/14 [place], DO 11/08/14 [place], DO 11/08/14 1849

## 2014-11-08 NOTE — Discharge Instructions (Signed)
Your thyroid function was abnormal and I recommend close follow-up with your primary care physician. Otherwise your workup today was reassuring. We'll also give a follow up information for cardiology and [place] as well as in [place]. Please call for outpatient follow-up and outpatient echocardiogram. If you develop chest pain, shortness of breath, sudden sweating, feeling like he may pass out or do pass out, please return to the hospital.    Palpitations A palpitation is the feeling that your heartbeat is irregular or is faster than normal. It may feel like your heart is fluttering or skipping a beat. Palpitations are usually not a serious problem. However, in some cases, you may need further medical evaluation. CAUSES  Palpitations can be caused by:  Smoking.  Caffeine or other stimulants, such as diet pills or energy drinks.  Alcohol.  Stress and anxiety.  Strenuous physical activity.  Fatigue.  Certain medicines.  Heart disease, especially if you have a history of irregular heart rhythms (arrhythmias), such as atrial fibrillation, atrial flutter, or supraventricular tachycardia.  An improperly working pacemaker or defibrillator. DIAGNOSIS  To find the cause of your palpitations, your health care provider will take your medical history and perform a physical exam. Your health care provider may also have you take a test called an ambulatory electrocardiogram (ECG). An ECG records your heartbeat patterns over a 24-hour period. You may also have other tests, such as:  Transthoracic echocardiogram (TTE). During echocardiography, sound waves are used to evaluate how blood flows through your heart.  Transesophageal echocardiogram (TEE).  Cardiac monitoring. This allows your health care provider to monitor your heart rate and rhythm in real time.  Holter monitor. This is a portable device that records your heartbeat and can help diagnose heart arrhythmias. It allows your health  care provider to track your heart activity for several days, if needed.  Stress tests by exercise or by giving medicine that makes the heart beat faster. TREATMENT  Treatment of palpitations depends on the cause of your symptoms and can vary greatly. Most cases of palpitations do not require any treatment other than time, relaxation, and monitoring your symptoms. Other causes, such as atrial fibrillation, atrial flutter, or supraventricular tachycardia, usually require further treatment. HOME CARE INSTRUCTIONS   Avoid:  Caffeinated coffee, tea, soft drinks, diet pills, and energy drinks.  Chocolate.  Alcohol.  Stop smoking if you smoke.  Reduce your stress and anxiety. Things that can help you relax include:  A method of controlling things in your body, such as your heartbeats, with your mind (biofeedback).  Yoga.  Meditation.  Physical activity such as swimming, jogging, or walking.  Get plenty of rest and sleep. SEEK MEDICAL CARE IF:   You continue to have a fast or irregular heartbeat beyond 24 hours.  Your palpitations occur more often. SEEK IMMEDIATE MEDICAL CARE IF:  You have chest pain or shortness of breath.  You have a severe headache.  You feel dizzy or you faint. MAKE SURE YOU:  Understand these instructions.  Will watch your condition.  Will get help right away if you are not doing well or get worse. Document Released: 07/17/2000 Document Revised: 07/25/2013 Document Reviewed: 09/18/2011 [facility] Patient Information 2015 [place], [place]. This information is not intended to replace advice given to you by your health care provider. Make sure you discuss any questions you have with your health care provider.

## 2014-11-08 NOTE — ED Notes (Signed)
Patient with no complaints at this time. Respirations even and unlabored. Skin warm/dry. Discharge instructions reviewed with patient at this time. Patient given opportunity to voice concerns/ask questions. Patient discharged at this time and left Emergency Department with steady gait.   

## 2014-11-08 NOTE — ED Notes (Signed)
C/O "rumbling" feeling in L lower chest wall.  Denies any pain, syncope, dizziness.

## 2014-11-09 LAB — T3, FREE: T3 FREE: 2.2 pg/mL (ref 2.0–4.4)

## 2014-11-09 LAB — T4, FREE: Free T4: 0.89 ng/dL (ref 0.80–1.80)

## 2014-11-20 DIAGNOSIS — E785 Hyperlipidemia, unspecified: Secondary | ICD-10-CM | POA: Diagnosis not present

## 2014-11-20 DIAGNOSIS — Z0000 Encounter for general adult medical examination without abnormal findings: Secondary | ICD-10-CM | POA: Diagnosis not present

## 2014-11-20 DIAGNOSIS — R7301 Impaired fasting glucose: Secondary | ICD-10-CM | POA: Diagnosis not present

## 2014-11-22 DIAGNOSIS — D696 Thrombocytopenia, unspecified: Secondary | ICD-10-CM | POA: Diagnosis not present

## 2014-11-22 DIAGNOSIS — R002 Palpitations: Secondary | ICD-10-CM | POA: Diagnosis not present

## 2014-11-22 DIAGNOSIS — C9511 Chronic leukemia of unspecified cell type, in remission: Secondary | ICD-10-CM | POA: Diagnosis not present

## 2014-11-22 DIAGNOSIS — R7301 Impaired fasting glucose: Secondary | ICD-10-CM | POA: Diagnosis not present

## 2014-12-21 ENCOUNTER — Ambulatory Visit (INDEPENDENT_AMBULATORY_CARE_PROVIDER_SITE_OTHER): Payer: Medicare Other | Admitting: Cardiology

## 2014-12-21 ENCOUNTER — Encounter: Payer: Self-pay | Admitting: Cardiology

## 2014-12-21 VITALS — BP 136/70 | HR 72 | Ht 68.0 in | Wt 181.0 lb

## 2014-12-21 DIAGNOSIS — R002 Palpitations: Secondary | ICD-10-CM

## 2014-12-21 DIAGNOSIS — I440 Atrioventricular block, first degree: Secondary | ICD-10-CM | POA: Diagnosis not present

## 2014-12-21 DIAGNOSIS — I350 Nonrheumatic aortic (valve) stenosis: Secondary | ICD-10-CM

## 2014-12-21 NOTE — Patient Instructions (Signed)
Your physician wants you to follow-up in: 6 months with Dr Bryna Colander will receive a reminder letter in the mail two months in advance. If you don't receive a letter, please call our office to schedule the follow-up appointment.   Your physician recommends that you continue on your current medications as directed. Please refer to the Current Medication list given to you today.   Your physician has recommended that you wear a holter monitor. Holter monitors are medical devices that record the heart's electrical activity. Doctors most often use these monitors to diagnose arrhythmias. Arrhythmias are problems with the speed or rhythm of the heartbeat. The monitor is a small, portable device. You can wear one while you do your normal daily activities. This is usually used to diagnose what is causing palpitations/syncope (passing out).   Your physician has requested that you have an echocardiogram. Echocardiography is a painless test that uses sound waves to create images of your heart. It provides your doctor with information about the size and shape of your heart and how well your heart's chambers and valves are working. This procedure takes approximately one hour. There are no restrictions for this procedure.     Thank you for choosing [facility] !

## 2014-12-21 NOTE — Progress Notes (Signed)
Clinical Summary Mr. Parcel is a 81 y.o.male seen today as a new patient for the following medical problems.  1. Palpitaitons - seen in ER 11/08/14 with palpitations - TSH 8, free T4 0.89, Mg 1.9, trop neg x 1, K 4.6, Cr 0.84, Hgb 16.8,  - CXR no acute process. EKG SR with long PR interval - no recurrent palpitations but does report episodes of intermittent dizziness that is not positional  2. Aortic stenosis - previous cardiologist in [facility] - hx of AVR sept 2006 with bovine tissue valve at [facility]. 21 mm Edwards tissue valve.  - Denies any DOE, no LE edema. No chest pain. No orthopnea.  Past Medical History  Diagnosis Date  . CLL (chronic lymphoblastic leukemia)   . CLL (chronic lymphocytic leukemia) with Richter's Transformation  09/02/2011  . Port catheter in place 09/12/2012     No Known Allergies   Current Outpatient Prescriptions  Medication Sig Dispense Refill  . aspirin EC 81 MG tablet Take 81 mg by mouth daily.    Marland Kitchen ENSURE (ENSURE) Take 237 mLs by mouth daily.    . Hyprom-Naphaz-Polysorb-Zn Sulf (CLEAR EYES COMPLETE) SOLN Apply 1 drop to eye every morning.    . Multiple Vitamins-Minerals (CENTRUM SILVER ADULT 50+ PO) Take 1 tablet by mouth daily.     No current facility-administered medications for this visit.     Past Surgical History  Procedure Laterality Date  . Appendectomy    . Aortic valve replacement       No Known Allergies    Family History  Problem Relation Age of Onset  . Cancer Brother     prostate cancer  . Cancer Son     bladder cancer     Social History Mr. Bontrager reports that he has quit smoking. He does not have any smokeless tobacco history on file. Mr. Hendershott has no alcohol history on file.   Review of Systems CONSTITUTIONAL: No weight loss, fever, chills, weakness or fatigue.  HEENT: Eyes: No visual loss, blurred vision, double vision or yellow sclerae.No hearing loss, sneezing, congestion,  runny nose or sore throat.  SKIN: No rash or itching.  CARDIOVASCULAR: per HPI RESPIRATORY: No shortness of breath, cough or sputum.  GASTROINTESTINAL: No anorexia, nausea, vomiting or diarrhea. No abdominal pain or blood.  GENITOURINARY: No burning on urination, no polyuria NEUROLOGICAL: +dizziness MUSCULOSKELETAL: No muscle, back pain, joint pain or stiffness.  LYMPHATICS: No enlarged nodes. No history of splenectomy.  PSYCHIATRIC: No history of depression or anxiety.  ENDOCRINOLOGIC: No reports of sweating, cold or heat intolerance. No polyuria or polydipsia.  Marland Kitchen   Physical Examination Filed Vitals:   12/21/14 0917  BP: 136/70  Pulse: 72   Filed Weights   12/21/14 0917  Weight: 181 lb (82.101 kg)    Gen: resting comfortably, no acute distress HEENT: no scleral icterus, pupils equal round and reactive, no palptable cervical adenopathy,  CV: RRR, 2/6 systolic murmur RUSB, no JVD Resp: Clear to auscultation bilaterally GI: abdomen is soft, non-tender, non-distended, normal bowel sounds, no hepatosplenomegaly MSK: extremities are warm, no edema.  Skin: warm, no rash Neuro:  no focal deficits Psych: appropriate affect    Assessment and Plan  1. Palpitations - isolated episode, no recurrence - he does report intermittent episodes of dizziness. With his very long 1st degree AV block concern would be if episodes could be bradycardia related. Will obtain 48 hr monitor  2. Aortic stenosis - previous tissue AVR  in 2006 - with episodes of dizziness will obtain echo   F/u 6 months   Arnoldo Lenis, M.D.

## 2014-12-25 ENCOUNTER — Ambulatory Visit (HOSPITAL_COMMUNITY)
Admission: RE | Admit: 2014-12-25 | Discharge: 2014-12-25 | Disposition: A | Discharge status: Home / Self Care | Payer: Medicare Other | Source: Ambulatory Visit | Attending: Cardiology | Admitting: Cardiology

## 2014-12-25 ENCOUNTER — Ambulatory Visit (INDEPENDENT_AMBULATORY_CARE_PROVIDER_SITE_OTHER)
Admission: RE | Admit: 2014-12-25 | Discharge: 2014-12-25 | Disposition: A | Discharge status: Home / Self Care | Payer: Medicare Other | Source: Ambulatory Visit | Attending: Cardiology | Admitting: Cardiology

## 2014-12-25 DIAGNOSIS — I350 Nonrheumatic aortic (valve) stenosis: Secondary | ICD-10-CM | POA: Insufficient documentation

## 2014-12-25 DIAGNOSIS — R002 Palpitations: Secondary | ICD-10-CM | POA: Diagnosis not present

## 2015-01-10 DIAGNOSIS — Z7982 Long term (current) use of aspirin: Secondary | ICD-10-CM | POA: Diagnosis not present

## 2015-01-10 DIAGNOSIS — Z9889 Other specified postprocedural states: Secondary | ICD-10-CM | POA: Diagnosis not present

## 2015-01-10 DIAGNOSIS — C8590 Non-Hodgkin lymphoma, unspecified, unspecified site: Secondary | ICD-10-CM | POA: Diagnosis not present

## 2015-01-10 DIAGNOSIS — R17 Unspecified jaundice: Secondary | ICD-10-CM | POA: Diagnosis not present

## 2015-01-10 DIAGNOSIS — R42 Dizziness and giddiness: Secondary | ICD-10-CM | POA: Diagnosis not present

## 2015-01-10 DIAGNOSIS — R634 Abnormal weight loss: Secondary | ICD-10-CM | POA: Diagnosis not present

## 2015-01-10 DIAGNOSIS — C9110 Chronic lymphocytic leukemia of B-cell type not having achieved remission: Secondary | ICD-10-CM | POA: Diagnosis not present

## 2015-01-17 DIAGNOSIS — H8110 Benign paroxysmal vertigo, unspecified ear: Secondary | ICD-10-CM | POA: Diagnosis not present

## 2015-01-21 DIAGNOSIS — C44729 Squamous cell carcinoma of skin of left lower limb, including hip: Secondary | ICD-10-CM | POA: Diagnosis not present

## 2015-01-21 DIAGNOSIS — D225 Melanocytic nevi of trunk: Secondary | ICD-10-CM | POA: Diagnosis not present

## 2015-01-21 DIAGNOSIS — X32XXXD Exposure to sunlight, subsequent encounter: Secondary | ICD-10-CM | POA: Diagnosis not present

## 2015-01-21 DIAGNOSIS — L570 Actinic keratosis: Secondary | ICD-10-CM | POA: Diagnosis not present

## 2015-02-20 DIAGNOSIS — Z23 Encounter for immunization: Secondary | ICD-10-CM | POA: Diagnosis not present

## 2015-03-14 DIAGNOSIS — Z08 Encounter for follow-up examination after completed treatment for malignant neoplasm: Secondary | ICD-10-CM | POA: Diagnosis not present

## 2015-03-14 DIAGNOSIS — L570 Actinic keratosis: Secondary | ICD-10-CM | POA: Diagnosis not present

## 2015-03-14 DIAGNOSIS — Z85828 Personal history of other malignant neoplasm of skin: Secondary | ICD-10-CM | POA: Diagnosis not present

## 2015-03-14 DIAGNOSIS — L821 Other seborrheic keratosis: Secondary | ICD-10-CM | POA: Diagnosis not present

## 2015-03-14 DIAGNOSIS — X32XXXD Exposure to sunlight, subsequent encounter: Secondary | ICD-10-CM | POA: Diagnosis not present

## 2015-06-03 DIAGNOSIS — D696 Thrombocytopenia, unspecified: Secondary | ICD-10-CM | POA: Diagnosis not present

## 2015-06-03 DIAGNOSIS — R7301 Impaired fasting glucose: Secondary | ICD-10-CM | POA: Diagnosis not present

## 2015-06-03 DIAGNOSIS — E782 Mixed hyperlipidemia: Secondary | ICD-10-CM | POA: Diagnosis not present

## 2015-06-05 DIAGNOSIS — C9511 Chronic leukemia of unspecified cell type, in remission: Secondary | ICD-10-CM | POA: Diagnosis not present

## 2015-06-05 DIAGNOSIS — Z23 Encounter for immunization: Secondary | ICD-10-CM | POA: Diagnosis not present

## 2015-06-05 DIAGNOSIS — Z953 Presence of xenogenic heart valve: Secondary | ICD-10-CM | POA: Diagnosis not present

## 2015-06-05 DIAGNOSIS — R7301 Impaired fasting glucose: Secondary | ICD-10-CM | POA: Diagnosis not present

## 2015-06-05 DIAGNOSIS — D696 Thrombocytopenia, unspecified: Secondary | ICD-10-CM | POA: Diagnosis not present

## 2015-06-05 IMAGING — DX DG CHEST 2V
2 series · 2 of 2 positions shown · non-contrast
Comparison: 06/09/2013

CLINICAL DATA: LEFT chest pain since last night, nonradiating,
lightheadedness, history CLL, former smoker

EXAM:
CHEST  2 VIEW

[chest pa]
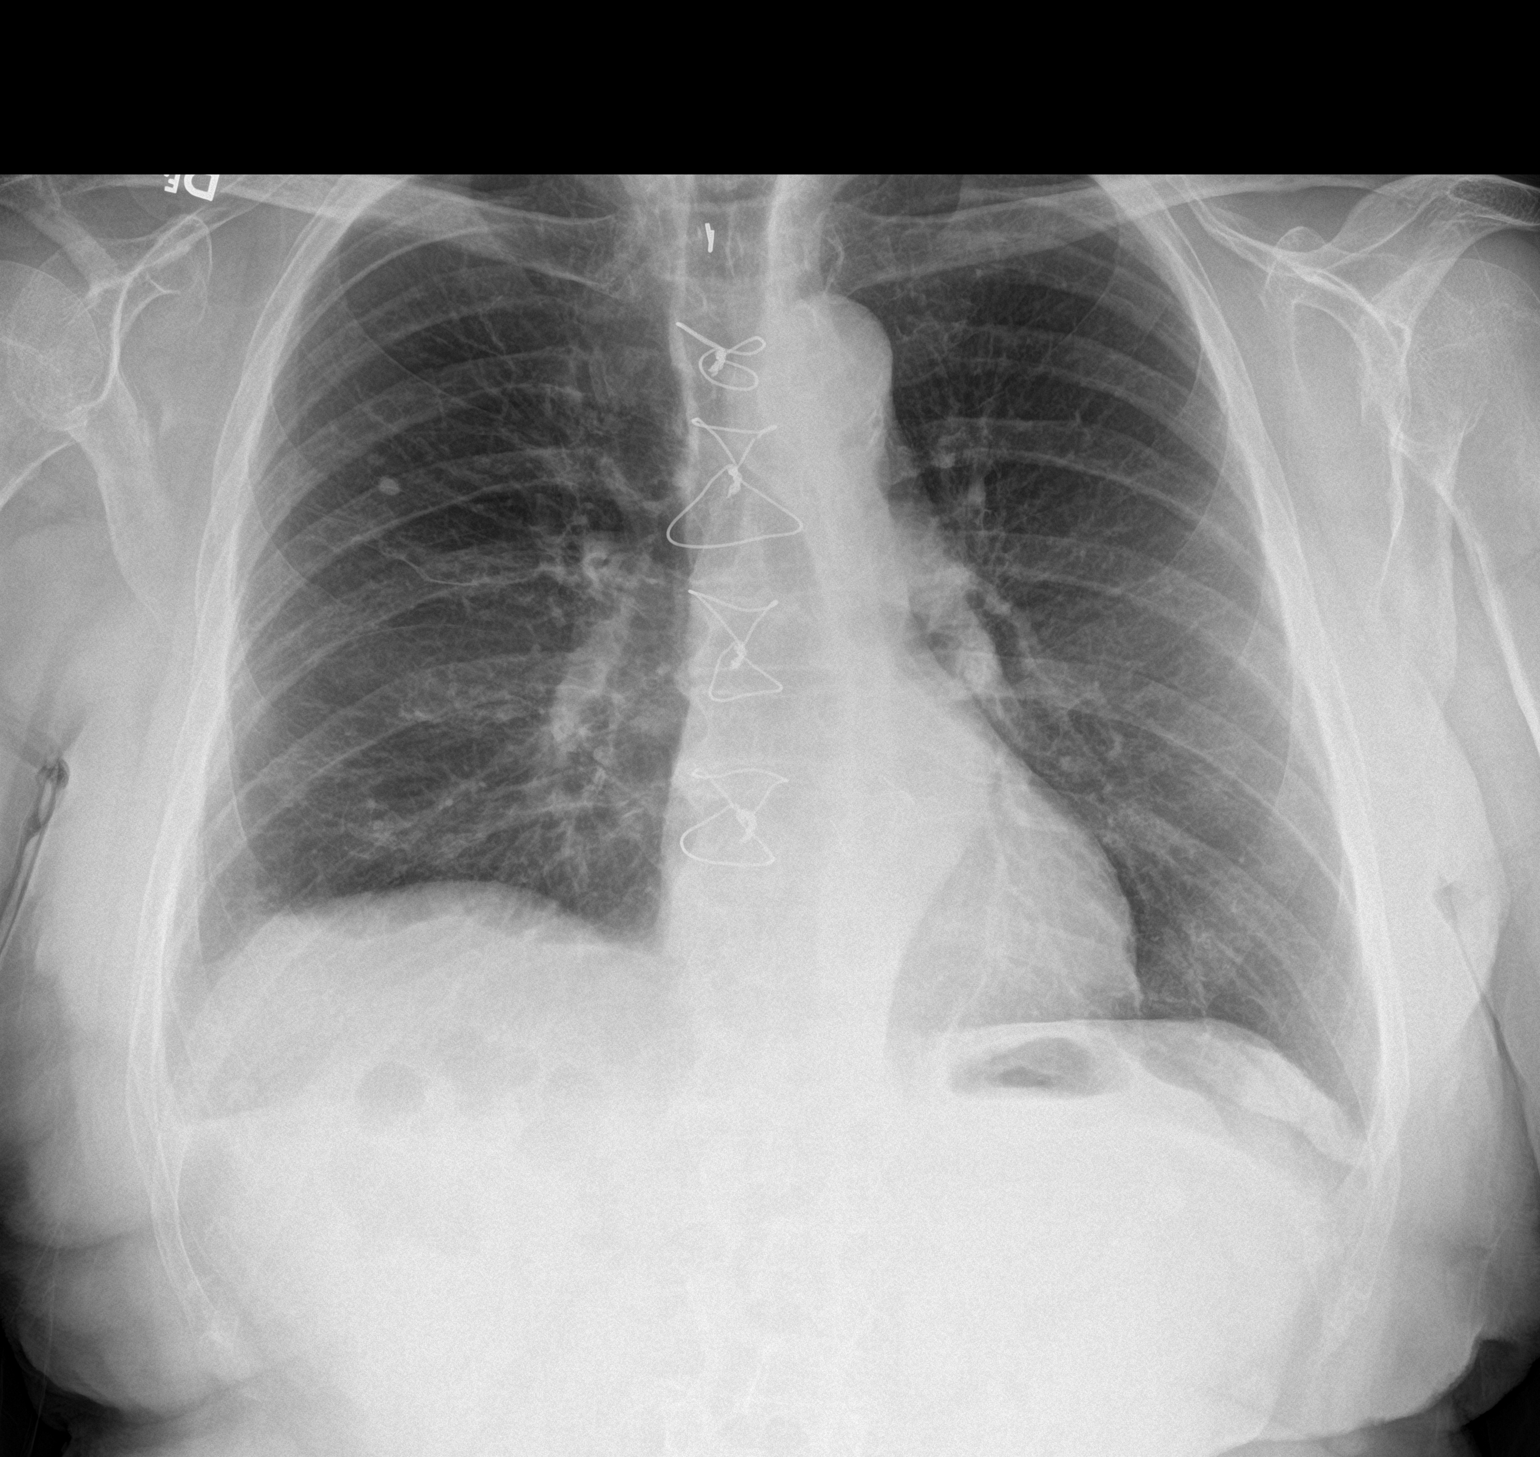

[chest lat]
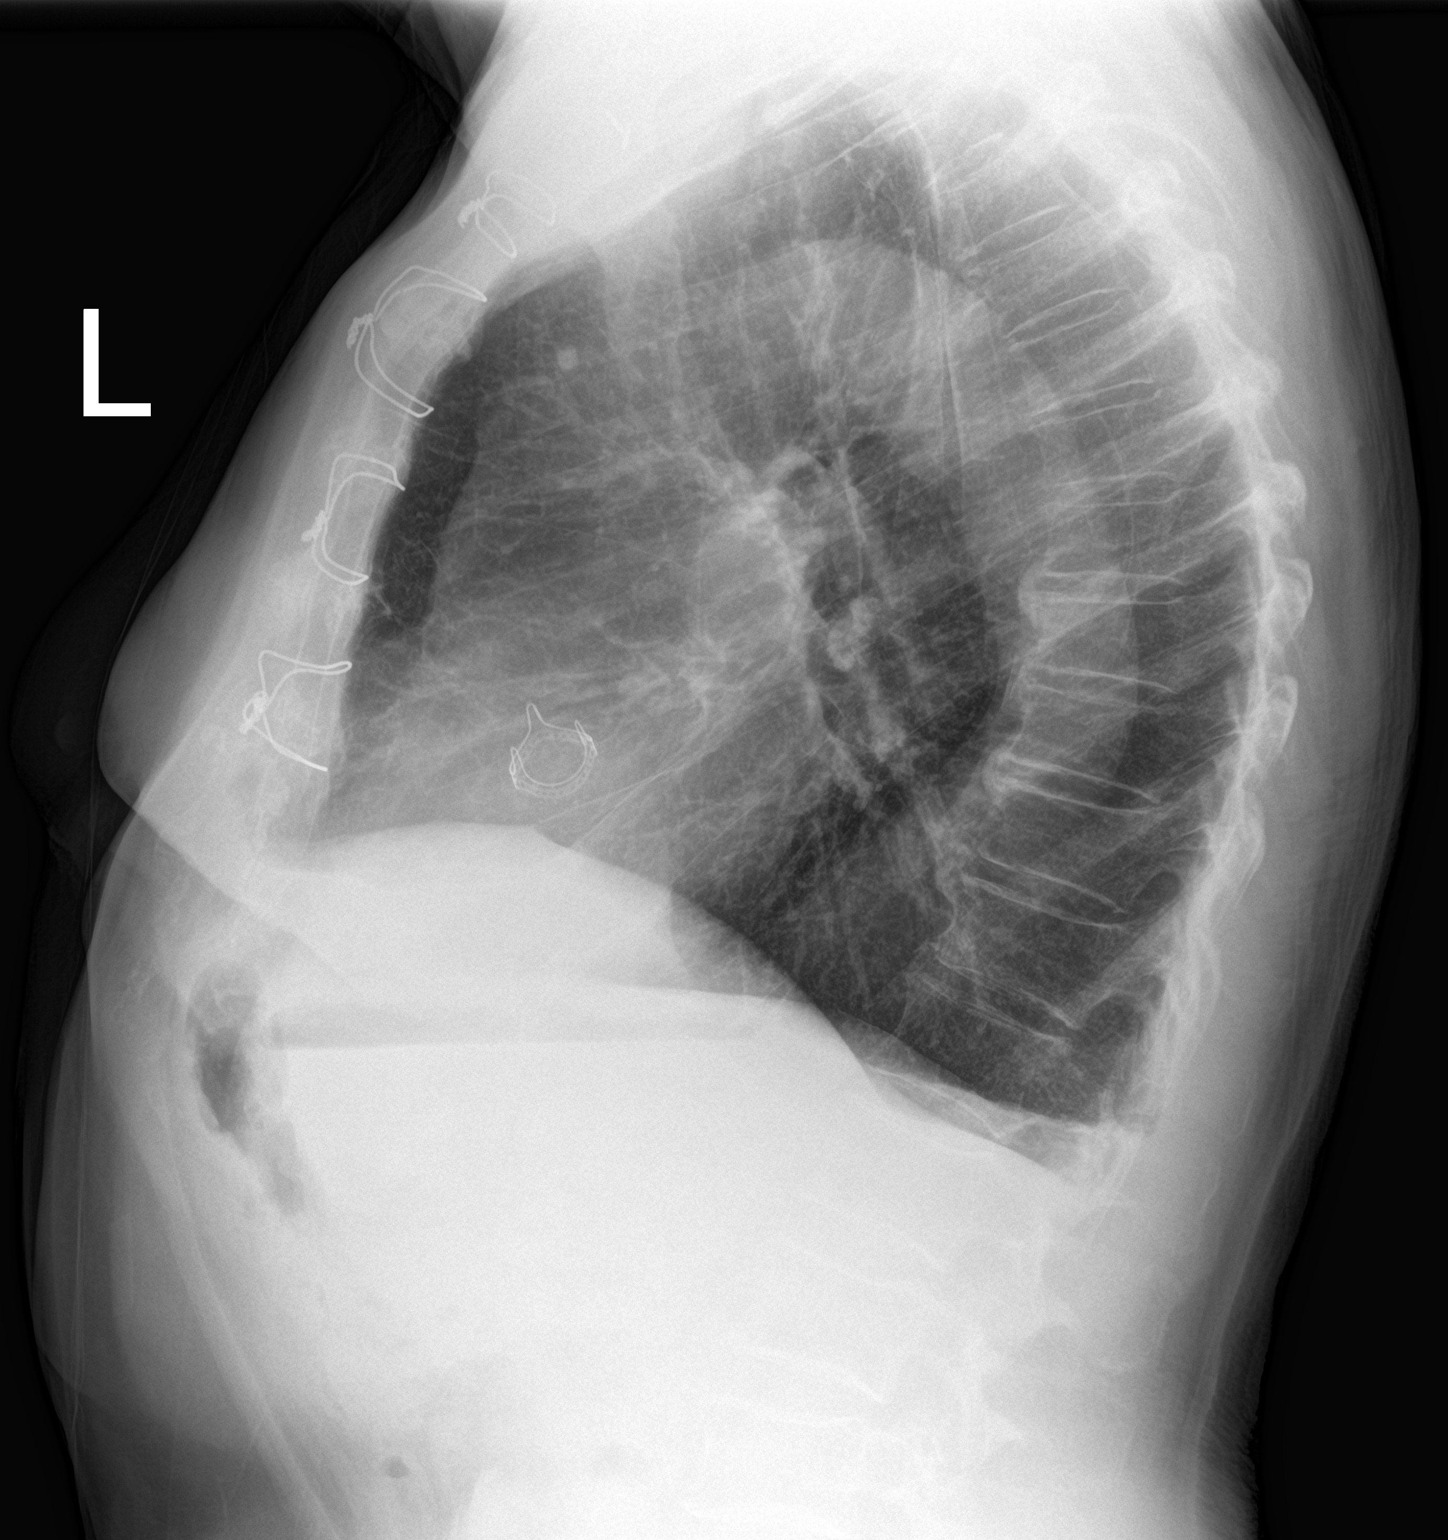

[2 of 2 positions shown; findings below may reference images not displayed]

FINDINGS: Normal heart size post median sternotomy and AVR.

Tortuous aorta with atherosclerotic calcification.

Mediastinal contours and pulmonary vascularity otherwise normal.

Calcified granuloma RIGHT upper lobe.

Hyperinflation and minimal central peribronchial thickening
suggesting COPD.

No acute infiltrate, pleural effusion or pneumothorax.

Bones demineralized.
IMPRESSION: Post AVR.

Question COPD.

No acute abnormalities.

## 2015-06-20 DIAGNOSIS — Z85828 Personal history of other malignant neoplasm of skin: Secondary | ICD-10-CM | POA: Diagnosis not present

## 2015-06-20 DIAGNOSIS — Z08 Encounter for follow-up examination after completed treatment for malignant neoplasm: Secondary | ICD-10-CM | POA: Diagnosis not present

## 2015-07-11 DIAGNOSIS — Z953 Presence of xenogenic heart valve: Secondary | ICD-10-CM | POA: Diagnosis not present

## 2015-07-11 DIAGNOSIS — R634 Abnormal weight loss: Secondary | ICD-10-CM | POA: Diagnosis not present

## 2015-07-11 DIAGNOSIS — C9110 Chronic lymphocytic leukemia of B-cell type not having achieved remission: Secondary | ICD-10-CM | POA: Diagnosis not present

## 2015-07-11 DIAGNOSIS — Z7982 Long term (current) use of aspirin: Secondary | ICD-10-CM | POA: Diagnosis not present

## 2015-07-11 DIAGNOSIS — R42 Dizziness and giddiness: Secondary | ICD-10-CM | POA: Diagnosis not present

## 2015-07-31 ENCOUNTER — Encounter: Payer: Self-pay | Admitting: Cardiology

## 2015-07-31 ENCOUNTER — Ambulatory Visit (INDEPENDENT_AMBULATORY_CARE_PROVIDER_SITE_OTHER): Payer: Medicare Other | Admitting: Cardiology

## 2015-07-31 VITALS — BP 128/80 | HR 97 | Ht 69.0 in | Wt 187.0 lb

## 2015-07-31 DIAGNOSIS — I350 Nonrheumatic aortic (valve) stenosis: Secondary | ICD-10-CM | POA: Diagnosis not present

## 2015-07-31 DIAGNOSIS — R002 Palpitations: Secondary | ICD-10-CM | POA: Diagnosis not present

## 2015-07-31 NOTE — Patient Instructions (Signed)
Your physician wants you to follow-up in: 1 Year with Dr. Branch. You will receive a reminder letter in the mail two months in advance. If you don't receive a letter, please call our office to schedule the follow-up appointment.   Your physician recommends that you continue on your current medications as directed. Please refer to the Current Medication list given to you today.  If you need a refill on your cardiac medications before your next appointment, please call your pharmacy.  Thank you for choosing [facility]!   

## 2015-07-31 NOTE — Progress Notes (Signed)
Patient ID: Raeshon Gantt, male   DOB: [birth year], 81 y.o.   MRN: [id number]     Clinical Summary Mr. Fjerstad is a 81 y.o.male seen today for follow up of the following medical problem.s   1. Palpitaitons - seen in ER 11/08/14 with palpitations - TSH 8, free T4 0.89, Mg 1.9, trop neg x 1, K 4.6, Cr 0.84, Hgb 16.8,  - CXR no acute process. EKG SR with long PR interval  - denies any recent symptoms.   2. Aortic stenosis - previous cardiologist in [facility] - hx of AVR sept 2006 with bovine tissue valve at [facility]. 21 mm Edwards tissue valve.  - echo 12/2014 showed normal bioprosthetic AV function  - walks at [facility] regularly and [employer] for 30 minutes without troubles. Denies any SOB or DOE, no chest pain.   3. CLL - followed by [facility]   Past Medical History  Diagnosis Date  . CLL (chronic lymphoblastic leukemia)   . CLL (chronic lymphocytic leukemia) with Richter's Transformation  09/02/2011  . Port catheter in place 09/12/2012     No Known Allergies   Current Outpatient Prescriptions  Medication Sig Dispense Refill  . aspirin EC 81 MG tablet Take 81 mg by mouth daily.    Marland Kitchen ENSURE (ENSURE) Take 237 mLs by mouth daily.    . Hyprom-Naphaz-Polysorb-Zn Sulf (CLEAR EYES COMPLETE) SOLN Apply 1 drop to eye every morning.    . Multiple Vitamins-Minerals (CENTRUM SILVER ADULT 50+ PO) Take 1 tablet by mouth daily.     No current facility-administered medications for this visit.     Past Surgical History  Procedure Laterality Date  . Appendectomy    . Aortic valve replacement       No Known Allergies    Family History  Problem Relation Age of Onset  . Cancer Brother     prostate cancer  . Cancer Son     bladder cancer     Social History Mr. Berman reports that he has quit smoking. He does not have any smokeless tobacco history on file. Mr. Culverhouse has no alcohol history on file.   Review of Systems CONSTITUTIONAL: No weight  loss, fever, chills, weakness or fatigue.  HEENT: Eyes: No visual loss, blurred vision, double vision or yellow sclerae.No hearing loss, sneezing, congestion, runny nose or sore throat.  SKIN: No rash or itching.  CARDIOVASCULAR: per HPI RESPIRATORY: No shortness of breath, cough or sputum.  GASTROINTESTINAL: No anorexia, nausea, vomiting or diarrhea. No abdominal pain or blood.  GENITOURINARY: No burning on urination, no polyuria NEUROLOGICAL: No headache, dizziness, syncope, paralysis, ataxia, numbness or tingling in the extremities. No change in bowel or bladder control.  MUSCULOSKELETAL: No muscle, back pain, joint pain or stiffness.  LYMPHATICS: No enlarged nodes. No history of splenectomy.  PSYCHIATRIC: No history of depression or anxiety.  ENDOCRINOLOGIC: No reports of sweating, cold or heat intolerance. No polyuria or polydipsia.  Marland Kitchen   Physical Examination Filed Vitals:   07/31/15 1536  BP: 128/80  Pulse: 97   Filed Vitals:   07/31/15 1536  Height: 5\' 9"  (1.753 m)  Weight: 187 lb (84.823 kg)    Gen: resting comfortably, no acute distress HEENT: no scleral icterus, pupils equal round and reactive, no palptable cervical adenopathy,  CV: RRR, 2/6 systolic murmur RUSB, no JVD Resp: Clear to auscultation bilaterally GI: abdomen is soft, non-tender, non-distended, normal bowel sounds, no hepatosplenomegaly MSK: extremities are warm, no edema.  Skin: warm, no rash  Neuro:  no focal deficits Psych: appropriate affect   Diagnostic Studies 12/2014 echo  Study Conclusions  - Left ventricle: The cavity size was normal. Wall thickness was normal. Systolic function was vigorous. The estimated ejection fraction was in the range of 65% to 70%. Wall motion was normal; there were no regional wall motion abnormalities. Doppler parameters are consistent with abnormal left ventricular relaxation (grade 1 diastolic dysfunction). - Aortic valve: A bioprosthesis was present -  Edwards 21 mm. There was no significant regurgitation. Mean gradient (S): 11 mm Hg. Peak gradient (S): 21 mm Hg. VTI ratio of LVOT to aortic valve: 0.51. - Mitral valve: Mildly thickened leaflets . Prolapse, involving the anterior leaflet and the posterior leaflet. There was mild regurgitation. - Left atrium: The atrium was mildly dilated. - Right atrium: Central venous pressure (est): 3 mm Hg. - Atrial septum: No defect or patent foramen ovale was identified. - Tricuspid valve: There was trivial regurgitation. - Pulmonary arteries: PA peak pressure: 31 mm Hg (S). - Pericardium, extracardiac: There was no pericardial effusion.  Impressions:  - Normal LV wall thickness with LVEF Q000111Q, grade 1 diastolic dysfunction. Mild left atrial enlargement. Mild bileaflet mitral prolapse with mild mitral regurgitation. Grossly normal bioprosthetic aortic valve without obvious perivalvular leak and mean gradient 11 mmHg - no prior study for comparison. Trivial tricuspid regurgitation with PASP 31 mmHg.  12/2014 holter monitor  Sinus rhythm with frequent PVC's and occasional PAC's.  No sustained arrhythmias.    Assessment and Plan   1. Palpitations - isolated episode, no recurrence - previous montior without significant arrhythmias - continue to monitor   2. Aortic stenosis - previous tissue AVR in 2006 - AVR with normal function by recent echo, no current symptoms - continue to monitor   F/u 1 year  Arnoldo Lenis, M.D.

## 2015-08-28 DIAGNOSIS — J00 Acute nasopharyngitis [common cold]: Secondary | ICD-10-CM | POA: Diagnosis not present

## 2015-08-28 DIAGNOSIS — R05 Cough: Secondary | ICD-10-CM | POA: Diagnosis not present

## 2015-10-31 DIAGNOSIS — B079 Viral wart, unspecified: Secondary | ICD-10-CM | POA: Diagnosis not present

## 2015-10-31 DIAGNOSIS — L820 Inflamed seborrheic keratosis: Secondary | ICD-10-CM | POA: Diagnosis not present

## 2015-12-16 DIAGNOSIS — R7301 Impaired fasting glucose: Secondary | ICD-10-CM | POA: Diagnosis not present

## 2015-12-16 DIAGNOSIS — E782 Mixed hyperlipidemia: Secondary | ICD-10-CM | POA: Diagnosis not present

## 2015-12-18 DIAGNOSIS — R7301 Impaired fasting glucose: Secondary | ICD-10-CM | POA: Diagnosis not present

## 2015-12-18 DIAGNOSIS — C9511 Chronic leukemia of unspecified cell type, in remission: Secondary | ICD-10-CM | POA: Diagnosis not present

## 2015-12-18 DIAGNOSIS — D0472 Carcinoma in situ of skin of left lower limb, including hip: Secondary | ICD-10-CM | POA: Diagnosis not present

## 2015-12-18 DIAGNOSIS — D696 Thrombocytopenia, unspecified: Secondary | ICD-10-CM | POA: Diagnosis not present

## 2015-12-18 DIAGNOSIS — R42 Dizziness and giddiness: Secondary | ICD-10-CM | POA: Diagnosis not present

## 2015-12-18 DIAGNOSIS — Z953 Presence of xenogenic heart valve: Secondary | ICD-10-CM | POA: Diagnosis not present

## 2016-01-09 DIAGNOSIS — R634 Abnormal weight loss: Secondary | ICD-10-CM | POA: Diagnosis not present

## 2016-01-09 DIAGNOSIS — Z7982 Long term (current) use of aspirin: Secondary | ICD-10-CM | POA: Diagnosis not present

## 2016-01-09 DIAGNOSIS — D696 Thrombocytopenia, unspecified: Secondary | ICD-10-CM | POA: Diagnosis not present

## 2016-01-09 DIAGNOSIS — Z8679 Personal history of other diseases of the circulatory system: Secondary | ICD-10-CM | POA: Diagnosis not present

## 2016-01-09 DIAGNOSIS — C9111 Chronic lymphocytic leukemia of B-cell type in remission: Secondary | ICD-10-CM | POA: Diagnosis not present

## 2016-01-09 DIAGNOSIS — C9110 Chronic lymphocytic leukemia of B-cell type not having achieved remission: Secondary | ICD-10-CM | POA: Diagnosis not present

## 2016-01-09 DIAGNOSIS — R42 Dizziness and giddiness: Secondary | ICD-10-CM | POA: Diagnosis not present

## 2016-01-09 DIAGNOSIS — R17 Unspecified jaundice: Secondary | ICD-10-CM | POA: Diagnosis not present

## 2016-05-18 DIAGNOSIS — Z23 Encounter for immunization: Secondary | ICD-10-CM | POA: Diagnosis not present

## 2016-06-23 DIAGNOSIS — E785 Hyperlipidemia, unspecified: Secondary | ICD-10-CM | POA: Diagnosis not present

## 2016-06-23 DIAGNOSIS — E039 Hypothyroidism, unspecified: Secondary | ICD-10-CM | POA: Diagnosis not present

## 2016-06-23 DIAGNOSIS — I482 Chronic atrial fibrillation: Secondary | ICD-10-CM | POA: Diagnosis not present

## 2016-06-23 DIAGNOSIS — I10 Essential (primary) hypertension: Secondary | ICD-10-CM | POA: Diagnosis not present

## 2016-06-23 DIAGNOSIS — E119 Type 2 diabetes mellitus without complications: Secondary | ICD-10-CM | POA: Diagnosis not present

## 2016-06-23 DIAGNOSIS — N529 Male erectile dysfunction, unspecified: Secondary | ICD-10-CM | POA: Diagnosis not present

## 2016-06-23 DIAGNOSIS — R7301 Impaired fasting glucose: Secondary | ICD-10-CM | POA: Diagnosis not present

## 2016-06-23 DIAGNOSIS — E782 Mixed hyperlipidemia: Secondary | ICD-10-CM | POA: Diagnosis not present

## 2016-07-01 DIAGNOSIS — Z0001 Encounter for general adult medical examination with abnormal findings: Secondary | ICD-10-CM | POA: Diagnosis not present

## 2016-07-01 DIAGNOSIS — D0472 Carcinoma in situ of skin of left lower limb, including hip: Secondary | ICD-10-CM | POA: Diagnosis not present

## 2016-07-01 DIAGNOSIS — R42 Dizziness and giddiness: Secondary | ICD-10-CM | POA: Diagnosis not present

## 2016-07-01 DIAGNOSIS — Z953 Presence of xenogenic heart valve: Secondary | ICD-10-CM | POA: Diagnosis not present

## 2016-07-01 DIAGNOSIS — R7301 Impaired fasting glucose: Secondary | ICD-10-CM | POA: Diagnosis not present

## 2016-07-01 DIAGNOSIS — D696 Thrombocytopenia, unspecified: Secondary | ICD-10-CM | POA: Diagnosis not present

## 2016-07-01 DIAGNOSIS — C9511 Chronic leukemia of unspecified cell type, in remission: Secondary | ICD-10-CM | POA: Diagnosis not present

## 2016-07-01 DIAGNOSIS — Z23 Encounter for immunization: Secondary | ICD-10-CM | POA: Diagnosis not present

## 2016-07-16 DIAGNOSIS — C9110 Chronic lymphocytic leukemia of B-cell type not having achieved remission: Secondary | ICD-10-CM | POA: Diagnosis not present

## 2016-08-13 ENCOUNTER — Ambulatory Visit (INDEPENDENT_AMBULATORY_CARE_PROVIDER_SITE_OTHER): Payer: Medicare Other | Admitting: Cardiology

## 2016-08-13 ENCOUNTER — Encounter: Payer: Self-pay | Admitting: Cardiology

## 2016-08-13 VITALS — BP 140/70 | HR 62 | Ht 67.0 in | Wt 185.0 lb

## 2016-08-13 DIAGNOSIS — R002 Palpitations: Secondary | ICD-10-CM | POA: Diagnosis not present

## 2016-08-13 DIAGNOSIS — I350 Nonrheumatic aortic (valve) stenosis: Secondary | ICD-10-CM

## 2016-08-13 NOTE — Patient Instructions (Signed)

## 2016-08-13 NOTE — Progress Notes (Signed)
Clinical Summary Mr. Brackeen is a 83 y.o.male seen today for follow up of the following medical problem.s   1. Palpitaitons - previously seen in ER 11/08/14 with palpitations - TSH 8, free T4 0.89, Mg 1.9, trop neg x 1, K 4.6, Cr 0.84, Hgb 16.8,  - CXR no acute process. EKG SR with long PR interval  - denies any recent palpitations since last visit   2. Aortic stenosis - hx of AVR sept 2006 with bovine tissue valve at [facility]. 21 mm Edwards tissue valve.  - echo 12/2014 showed normal bioprosthetic AV function  - walks at [facility] regularly and [employer] for 30 minutes without troubles. Denies any SOB or DOE, no chest pain.  - denies any recent SOB/DOE.    3. CLL - followed by [facility]  SH: son is a patient of mine as well. Past Medical History:  Diagnosis Date  . CLL (chronic lymphoblastic leukemia)   . CLL (chronic lymphocytic leukemia) with Richter's Transformation  09/02/2011  . Port catheter in place 09/12/2012     No Known Allergies   Current Outpatient Prescriptions  Medication Sig Dispense Refill  . aspirin EC 81 MG tablet Take 81 mg by mouth daily.    Marland Kitchen ENSURE (ENSURE) Take 237 mLs by mouth daily.    . Hyprom-Naphaz-Polysorb-Zn Sulf (CLEAR EYES COMPLETE) SOLN Apply 1 drop to eye every morning.    . Multiple Vitamins-Minerals (CENTRUM SILVER ADULT 50+ PO) Take 1 tablet by mouth daily.     No current facility-administered medications for this visit.      Past Surgical History:  Procedure Laterality Date  . AORTIC VALVE REPLACEMENT    . APPENDECTOMY       No Known Allergies    Family History  Problem Relation Age of Onset  . Cancer Brother     prostate cancer  . Cancer Son     bladder cancer     Social History Mr. Feser reports that he has quit smoking. He does not have any smokeless tobacco history on file. Mr. Vanblaricom has no alcohol history on file.   Review of Systems CONSTITUTIONAL: No weight loss, fever,  chills, weakness or fatigue.  HEENT: Eyes: No visual loss, blurred vision, double vision or yellow sclerae.No hearing loss, sneezing, congestion, runny nose or sore throat.  SKIN: No rash or itching.  CARDIOVASCULAR: per hpi RESPIRATORY: No shortness of breath, cough or sputum.  GASTROINTESTINAL: No anorexia, nausea, vomiting or diarrhea. No abdominal pain or blood.  GENITOURINARY: No burning on urination, no polyuria NEUROLOGICAL: No headache, dizziness, syncope, paralysis, ataxia, numbness or tingling in the extremities. No change in bowel or bladder control.  MUSCULOSKELETAL: No muscle, back pain, joint pain or stiffness.  LYMPHATICS: No enlarged nodes. No history of splenectomy.  PSYCHIATRIC: No history of depression or anxiety.  ENDOCRINOLOGIC: No reports of sweating, cold or heat intolerance. No polyuria or polydipsia.  Marland Kitchen   Physical Examination Vitals:   08/13/16 1013  BP: 140/70  Pulse: 62   Vitals:   08/13/16 1013  Weight: 185 lb (83.9 kg)  Height: 5\' 7"  (1.702 m)    Gen: resting comfortably, no acute distress HEENT: no scleral icterus, pupils equal round and reactive, no palptable cervical adenopathy,  CV: RRR, 2/6 systolic murmur RUSB, no jvd Resp: Clear to auscultation bilaterally GI: abdomen is soft, non-tender, non-distended, normal bowel sounds, no hepatosplenomegaly MSK: extremities are warm, no edema.  Skin: warm, no rash Neuro:  no focal  deficits Psych: appropriate affect   Diagnostic Studies 12/2014 echo  Study Conclusions  - Left ventricle: The cavity size was normal. Wall thickness was normal. Systolic function was vigorous. The estimated ejection fraction was in the range of 65% to 70%. Wall motion was normal; there were no regional wall motion abnormalities. Doppler parameters are consistent with abnormal left ventricular relaxation (grade 1 diastolic dysfunction). - Aortic valve: A bioprosthesis was present - Edwards 21 mm.  There was no significant regurgitation. Mean gradient (S): 11 mm Hg. Peak gradient (S): 21 mm Hg. VTI ratio of LVOT to aortic valve: 0.51. - Mitral valve: Mildly thickened leaflets . Prolapse, involving the anterior leaflet and the posterior leaflet. There was mild regurgitation. - Left atrium: The atrium was mildly dilated. - Right atrium: Central venous pressure (est): 3 mm Hg. - Atrial septum: No defect or patent foramen ovale was identified. - Tricuspid valve: There was trivial regurgitation. - Pulmonary arteries: PA peak pressure: 31 mm Hg (S). - Pericardium, extracardiac: There was no pericardial effusion.  Impressions:  - Normal LV wall thickness with LVEF Q000111Q, grade 1 diastolic dysfunction. Mild left atrial enlargement. Mild bileaflet mitral prolapse with mild mitral regurgitation. Grossly normal bioprosthetic aortic valve without obvious perivalvular leak and mean gradient 11 mmHg - no prior study for comparison. Trivial tricuspid regurgitation with PASP 31 mmHg.  12/2014 holter monitor  Sinus rhythm with frequent PVC's and occasional PAC's.  No sustained arrhythmias.    Assessment and Plan  1. Palpitations - isolated episode - previous montior without significant arrhythmias. EKG today shows SR with 1st degree AV block.  - continue to follow clinically   2. Aortic stenosis - previous tissue AVR in 2006 - normal functioning AVR by echo 12/2014 - no symptoms, continue to monitor   F/u 1 year      Arnoldo Lenis, M.D.

## 2016-08-25 DIAGNOSIS — L089 Local infection of the skin and subcutaneous tissue, unspecified: Secondary | ICD-10-CM | POA: Diagnosis not present

## 2016-09-28 DIAGNOSIS — Z6826 Body mass index (BMI) 26.0-26.9, adult: Secondary | ICD-10-CM | POA: Diagnosis not present

## 2016-09-28 DIAGNOSIS — R040 Epistaxis: Secondary | ICD-10-CM | POA: Diagnosis not present

## 2016-10-13 DIAGNOSIS — R040 Epistaxis: Secondary | ICD-10-CM | POA: Diagnosis not present

## 2016-10-15 DIAGNOSIS — R040 Epistaxis: Secondary | ICD-10-CM | POA: Diagnosis not present

## 2016-12-24 DIAGNOSIS — I10 Essential (primary) hypertension: Secondary | ICD-10-CM | POA: Diagnosis not present

## 2016-12-24 DIAGNOSIS — R7301 Impaired fasting glucose: Secondary | ICD-10-CM | POA: Diagnosis not present

## 2016-12-29 DIAGNOSIS — C9511 Chronic leukemia of unspecified cell type, in remission: Secondary | ICD-10-CM | POA: Diagnosis not present

## 2016-12-29 DIAGNOSIS — R7301 Impaired fasting glucose: Secondary | ICD-10-CM | POA: Diagnosis not present

## 2016-12-29 DIAGNOSIS — Z953 Presence of xenogenic heart valve: Secondary | ICD-10-CM | POA: Diagnosis not present

## 2016-12-29 DIAGNOSIS — Z6825 Body mass index (BMI) 25.0-25.9, adult: Secondary | ICD-10-CM | POA: Diagnosis not present

## 2016-12-29 DIAGNOSIS — R197 Diarrhea, unspecified: Secondary | ICD-10-CM | POA: Diagnosis not present

## 2016-12-29 DIAGNOSIS — R42 Dizziness and giddiness: Secondary | ICD-10-CM | POA: Diagnosis not present

## 2016-12-29 DIAGNOSIS — E882 Lipomatosis, not elsewhere classified: Secondary | ICD-10-CM | POA: Diagnosis not present

## 2016-12-29 DIAGNOSIS — D0472 Carcinoma in situ of skin of left lower limb, including hip: Secondary | ICD-10-CM | POA: Diagnosis not present

## 2016-12-29 DIAGNOSIS — D696 Thrombocytopenia, unspecified: Secondary | ICD-10-CM | POA: Diagnosis not present

## 2017-01-07 DIAGNOSIS — C8339 Diffuse large B-cell lymphoma, extranodal and solid organ sites: Secondary | ICD-10-CM | POA: Diagnosis not present

## 2017-01-07 DIAGNOSIS — Z9889 Other specified postprocedural states: Secondary | ICD-10-CM | POA: Diagnosis not present

## 2017-01-07 DIAGNOSIS — Z953 Presence of xenogenic heart valve: Secondary | ICD-10-CM | POA: Diagnosis not present

## 2017-01-07 DIAGNOSIS — R42 Dizziness and giddiness: Secondary | ICD-10-CM | POA: Diagnosis not present

## 2017-01-07 DIAGNOSIS — C9190 Lymphoid leukemia, unspecified not having achieved remission: Secondary | ICD-10-CM | POA: Diagnosis not present

## 2017-02-11 DIAGNOSIS — X32XXXD Exposure to sunlight, subsequent encounter: Secondary | ICD-10-CM | POA: Diagnosis not present

## 2017-02-11 DIAGNOSIS — D225 Melanocytic nevi of trunk: Secondary | ICD-10-CM | POA: Diagnosis not present

## 2017-02-11 DIAGNOSIS — L570 Actinic keratosis: Secondary | ICD-10-CM | POA: Diagnosis not present

## 2017-02-11 DIAGNOSIS — L304 Erythema intertrigo: Secondary | ICD-10-CM | POA: Diagnosis not present

## 2017-02-11 DIAGNOSIS — D045 Carcinoma in situ of skin of trunk: Secondary | ICD-10-CM | POA: Diagnosis not present

## 2017-05-11 DIAGNOSIS — Z23 Encounter for immunization: Secondary | ICD-10-CM | POA: Diagnosis not present

## 2017-06-28 DIAGNOSIS — J209 Acute bronchitis, unspecified: Secondary | ICD-10-CM | POA: Diagnosis not present

## 2017-06-28 DIAGNOSIS — R05 Cough: Secondary | ICD-10-CM | POA: Diagnosis not present

## 2017-06-30 DIAGNOSIS — D696 Thrombocytopenia, unspecified: Secondary | ICD-10-CM | POA: Diagnosis not present

## 2017-06-30 DIAGNOSIS — C9511 Chronic leukemia of unspecified cell type, in remission: Secondary | ICD-10-CM | POA: Diagnosis not present

## 2017-06-30 DIAGNOSIS — R7301 Impaired fasting glucose: Secondary | ICD-10-CM | POA: Diagnosis not present

## 2017-07-02 DIAGNOSIS — D0472 Carcinoma in situ of skin of left lower limb, including hip: Secondary | ICD-10-CM | POA: Diagnosis not present

## 2017-07-02 DIAGNOSIS — E882 Lipomatosis, not elsewhere classified: Secondary | ICD-10-CM | POA: Diagnosis not present

## 2017-07-02 DIAGNOSIS — R7301 Impaired fasting glucose: Secondary | ICD-10-CM | POA: Diagnosis not present

## 2017-07-02 DIAGNOSIS — D696 Thrombocytopenia, unspecified: Secondary | ICD-10-CM | POA: Diagnosis not present

## 2017-07-02 DIAGNOSIS — Z953 Presence of xenogenic heart valve: Secondary | ICD-10-CM | POA: Diagnosis not present

## 2017-07-02 DIAGNOSIS — R42 Dizziness and giddiness: Secondary | ICD-10-CM | POA: Diagnosis not present

## 2017-07-02 DIAGNOSIS — Z6828 Body mass index (BMI) 28.0-28.9, adult: Secondary | ICD-10-CM | POA: Diagnosis not present

## 2017-07-02 DIAGNOSIS — C9511 Chronic leukemia of unspecified cell type, in remission: Secondary | ICD-10-CM | POA: Diagnosis not present

## 2017-08-12 DIAGNOSIS — Z8679 Personal history of other diseases of the circulatory system: Secondary | ICD-10-CM | POA: Diagnosis not present

## 2017-08-12 DIAGNOSIS — Z954 Presence of other heart-valve replacement: Secondary | ICD-10-CM | POA: Diagnosis not present

## 2017-08-12 DIAGNOSIS — Z7982 Long term (current) use of aspirin: Secondary | ICD-10-CM | POA: Diagnosis not present

## 2017-08-12 DIAGNOSIS — Z953 Presence of xenogenic heart valve: Secondary | ICD-10-CM | POA: Diagnosis not present

## 2017-08-12 DIAGNOSIS — Z8674 Personal history of sudden cardiac arrest: Secondary | ICD-10-CM | POA: Diagnosis not present

## 2017-08-12 DIAGNOSIS — R42 Dizziness and giddiness: Secondary | ICD-10-CM | POA: Diagnosis not present

## 2017-08-12 DIAGNOSIS — C9110 Chronic lymphocytic leukemia of B-cell type not having achieved remission: Secondary | ICD-10-CM | POA: Diagnosis not present

## 2017-08-12 DIAGNOSIS — E875 Hyperkalemia: Secondary | ICD-10-CM | POA: Diagnosis not present

## 2017-08-12 DIAGNOSIS — Z856 Personal history of leukemia: Secondary | ICD-10-CM | POA: Diagnosis not present

## 2017-10-21 DIAGNOSIS — C228 Malignant neoplasm of liver, primary, unspecified as to type: Secondary | ICD-10-CM | POA: Diagnosis not present

## 2017-10-21 DIAGNOSIS — Z953 Presence of xenogenic heart valve: Secondary | ICD-10-CM | POA: Diagnosis not present

## 2017-10-21 DIAGNOSIS — Z6828 Body mass index (BMI) 28.0-28.9, adult: Secondary | ICD-10-CM | POA: Diagnosis not present

## 2017-10-21 DIAGNOSIS — S80219A Abrasion, unspecified knee, initial encounter: Secondary | ICD-10-CM | POA: Diagnosis not present

## 2017-10-21 DIAGNOSIS — C9511 Chronic leukemia of unspecified cell type, in remission: Secondary | ICD-10-CM | POA: Diagnosis not present

## 2017-10-21 DIAGNOSIS — D696 Thrombocytopenia, unspecified: Secondary | ICD-10-CM | POA: Diagnosis not present

## 2017-10-21 DIAGNOSIS — S61411A Laceration without foreign body of right hand, initial encounter: Secondary | ICD-10-CM | POA: Diagnosis not present

## 2017-10-21 DIAGNOSIS — D0472 Carcinoma in situ of skin of left lower limb, including hip: Secondary | ICD-10-CM | POA: Diagnosis not present

## 2017-10-21 DIAGNOSIS — E882 Lipomatosis, not elsewhere classified: Secondary | ICD-10-CM | POA: Diagnosis not present

## 2017-10-21 DIAGNOSIS — R7301 Impaired fasting glucose: Secondary | ICD-10-CM | POA: Diagnosis not present

## 2017-10-21 DIAGNOSIS — S0091XA Abrasion of unspecified part of head, initial encounter: Secondary | ICD-10-CM | POA: Diagnosis not present

## 2017-10-21 DIAGNOSIS — R42 Dizziness and giddiness: Secondary | ICD-10-CM | POA: Diagnosis not present

## 2017-10-26 DIAGNOSIS — R7301 Impaired fasting glucose: Secondary | ICD-10-CM | POA: Diagnosis not present

## 2017-10-26 DIAGNOSIS — C228 Malignant neoplasm of liver, primary, unspecified as to type: Secondary | ICD-10-CM | POA: Diagnosis not present

## 2017-10-26 DIAGNOSIS — D696 Thrombocytopenia, unspecified: Secondary | ICD-10-CM | POA: Diagnosis not present

## 2017-10-29 DIAGNOSIS — C228 Malignant neoplasm of liver, primary, unspecified as to type: Secondary | ICD-10-CM | POA: Diagnosis not present

## 2017-10-29 DIAGNOSIS — Z6828 Body mass index (BMI) 28.0-28.9, adult: Secondary | ICD-10-CM | POA: Diagnosis not present

## 2017-10-29 DIAGNOSIS — D0472 Carcinoma in situ of skin of left lower limb, including hip: Secondary | ICD-10-CM | POA: Diagnosis not present

## 2017-10-29 DIAGNOSIS — Z953 Presence of xenogenic heart valve: Secondary | ICD-10-CM | POA: Diagnosis not present

## 2017-10-29 DIAGNOSIS — R7301 Impaired fasting glucose: Secondary | ICD-10-CM | POA: Diagnosis not present

## 2017-10-29 DIAGNOSIS — D696 Thrombocytopenia, unspecified: Secondary | ICD-10-CM | POA: Diagnosis not present

## 2017-10-29 DIAGNOSIS — D179 Benign lipomatous neoplasm, unspecified: Secondary | ICD-10-CM | POA: Diagnosis not present

## 2017-10-29 DIAGNOSIS — C9511 Chronic leukemia of unspecified cell type, in remission: Secondary | ICD-10-CM | POA: Diagnosis not present

## 2017-10-29 DIAGNOSIS — R42 Dizziness and giddiness: Secondary | ICD-10-CM | POA: Diagnosis not present

## 2017-11-30 ENCOUNTER — Encounter: Payer: Self-pay | Admitting: *Deleted

## 2017-11-30 ENCOUNTER — Encounter: Payer: Self-pay | Admitting: Cardiology

## 2017-11-30 ENCOUNTER — Ambulatory Visit (INDEPENDENT_AMBULATORY_CARE_PROVIDER_SITE_OTHER): Payer: Medicare Other | Admitting: Cardiology

## 2017-11-30 VITALS — BP 116/73 | HR 73 | Ht 67.0 in | Wt 174.0 lb

## 2017-11-30 DIAGNOSIS — R002 Palpitations: Secondary | ICD-10-CM

## 2017-11-30 DIAGNOSIS — I440 Atrioventricular block, first degree: Secondary | ICD-10-CM

## 2017-11-30 DIAGNOSIS — I350 Nonrheumatic aortic (valve) stenosis: Secondary | ICD-10-CM

## 2017-11-30 NOTE — Progress Notes (Signed)
Clinical Summary Jimmy Russell is a 84 y.o.male seen today for follow up of the following medical problems.  1. Palpitaitons - previously seen in ER 11/08/14 with palpitations - TSH 8, free T4 0.89, Mg 1.9, trop neg x 1, K 4.6, Cr 0.84, Hgb 16.8,  - CXR no acute process. EKG SR with long PR interval  - denies any recent palpitations since last visit   2. Aortic stenosis - hx of AVR sept 2006 with bovine tissue valve at [facility]. 21 mm Edwards tissue valve.  - echo 12/2014 showed normal bioprosthetic AV function   - walks reguarly up to 30 minutes at [place] or [employer] without symptoms - no recent symptoms.   3. CLL - followed by [facility]  SH: son is a patient of mine as Jimmy Russell    Past Medical History:  Diagnosis Date  . CLL (chronic lymphoblastic leukemia)   . CLL (chronic lymphocytic leukemia) with Richter's Transformation  09/02/2011  . Port catheter in place 09/12/2012     No Known Allergies   Current Outpatient Medications  Medication Sig Dispense Refill  . aspirin EC 81 MG tablet Take 81 mg by mouth daily.    Marland Kitchen ENSURE (ENSURE) Take 237 mLs by mouth daily.    . Hyprom-Naphaz-Polysorb-Zn Sulf (CLEAR EYES COMPLETE) SOLN Apply 1 drop to eye every morning.    . Multiple Vitamins-Minerals (CENTRUM SILVER ADULT 50+ PO) Take 1 tablet by mouth daily.     No current facility-administered medications for this visit.      Past Surgical History:  Procedure Laterality Date  . AORTIC VALVE REPLACEMENT    . APPENDECTOMY       No Known Allergies    Family History  Problem Relation Age of Onset  . Cancer Brother        prostate cancer  . Cancer Son        bladder cancer  . Macular degeneration Sister      Social History Jimmy Russell reports that he has quit smoking. He has never used smokeless tobacco. Jimmy Russell has no alcohol history on file.   Review of Systems CONSTITUTIONAL: No weight loss, fever, chills,  weakness or fatigue.  HEENT: Eyes: No visual loss, blurred vision, double vision or yellow sclerae.No hearing loss, sneezing, congestion, runny nose or sore throat.  SKIN: No rash or itching.  CARDIOVASCULAR: per hpi RESPIRATORY: No shortness of breath, cough or sputum.  GASTROINTESTINAL: No anorexia, nausea, vomiting or diarrhea. No abdominal pain or blood.  GENITOURINARY: No burning on urination, no polyuria NEUROLOGICAL: No headache, dizziness, syncope, paralysis, ataxia, numbness or tingling in the extremities. No change in bowel or bladder control.  MUSCULOSKELETAL: No muscle, back pain, joint pain or stiffness.  LYMPHATICS: No enlarged nodes. No history of splenectomy.  PSYCHIATRIC: No history of depression or anxiety.  ENDOCRINOLOGIC: No reports of sweating, cold or heat intolerance. No polyuria or polydipsia.  Marland Kitchen   Physical Examination Vitals:   11/30/17 1424  BP: 116/73  Pulse: 73  SpO2: 96%   Vitals:   11/30/17 1424  Weight: 174 lb (78.9 kg)  Height: 5\' 7"  (1.702 m)    Gen: resting comfortably, no acute distress HEENT: no scleral icterus, pupils equal round and reactive, no palptable cervical adenopathy,  CV: RRR, 2/6 systolic murmur rusb, no jvd Resp: Clear to auscultation bilaterally GI: abdomen is soft, non-tender, non-distended, normal bowel sounds, no hepatosplenomegaly MSK: extremities are warm, no edema.  Skin: warm, no  rash Neuro:  no focal deficits Psych: appropriate affect   Diagnostic Studies  12/2014 echo  Study Conclusions  - Left ventricle: The cavity size was normal. Wall thickness was normal. Systolic function was vigorous. The estimated ejection fraction was in the range of 65% to 70%. Wall motion was normal; there were no regional wall motion abnormalities. Doppler parameters are consistent with abnormal left ventricular relaxation (grade 1 diastolic dysfunction). - Aortic valve: A bioprosthesis was present - Edwards 21 mm.  There was no significant regurgitation. Mean gradient (S): 11 mm Hg. Peak gradient (S): 21 mm Hg. VTI ratio of LVOT to aortic valve: 0.51. - Mitral valve: Mildly thickened leaflets . Prolapse, involving the anterior leaflet and the posterior leaflet. There was mild regurgitation. - Left atrium: The atrium was mildly dilated. - Right atrium: Central venous pressure (est): 3 mm Hg. - Atrial septum: No defect or patent foramen ovale was identified. - Tricuspid valve: There was trivial regurgitation. - Pulmonary arteries: PA peak pressure: 31 mm Hg (S). - Pericardium, extracardiac: There was no pericardial effusion.  Impressions:  - Normal LV wall thickness with LVEF 09-81%, grade 1 diastolic dysfunction. Mild left atrial enlargement. Mild bileaflet mitral prolapse with mild mitral regurgitation. Grossly normal bioprosthetic aortic valve without obvious perivalvular leak and mean gradient 11 mmHg - no prior study for comparison. Trivial tricuspid regurgitation with PASP 31 mmHg.  12/2014 holter monitor  Sinus rhythm with frequent PVC's and occasional PAC's.  No sustained arrhythmias.     Assessment and Plan  1. Palpitations - previous montior without significant arrhythmias. - EKG today SR, long first degree AV block - no symptoms, continue to monitor   2. Aortic stenosis - previous tissue AVR in 2006 - normal functioning AVR by echo 12/2014 - no recent symptoms, continue to monitor  3. First degree AV block - chronic - no symptoms, continue to monitor.    F/u 1 year       Arnoldo Lenis, M.D.,

## 2017-11-30 NOTE — Patient Instructions (Signed)

## 2017-12-05 ENCOUNTER — Encounter: Payer: Self-pay | Admitting: Cardiology

## 2018-01-27 DIAGNOSIS — D0472 Carcinoma in situ of skin of left lower limb, including hip: Secondary | ICD-10-CM | POA: Diagnosis not present

## 2018-01-27 DIAGNOSIS — C9511 Chronic leukemia of unspecified cell type, in remission: Secondary | ICD-10-CM | POA: Diagnosis not present

## 2018-01-27 DIAGNOSIS — D179 Benign lipomatous neoplasm, unspecified: Secondary | ICD-10-CM | POA: Diagnosis not present

## 2018-01-27 DIAGNOSIS — R42 Dizziness and giddiness: Secondary | ICD-10-CM | POA: Diagnosis not present

## 2018-01-27 DIAGNOSIS — J060 Acute laryngopharyngitis: Secondary | ICD-10-CM | POA: Diagnosis not present

## 2018-01-27 DIAGNOSIS — C228 Malignant neoplasm of liver, primary, unspecified as to type: Secondary | ICD-10-CM | POA: Diagnosis not present

## 2018-01-27 DIAGNOSIS — S61411A Laceration without foreign body of right hand, initial encounter: Secondary | ICD-10-CM | POA: Diagnosis not present

## 2018-01-27 DIAGNOSIS — Z953 Presence of xenogenic heart valve: Secondary | ICD-10-CM | POA: Diagnosis not present

## 2018-01-27 DIAGNOSIS — D696 Thrombocytopenia, unspecified: Secondary | ICD-10-CM | POA: Diagnosis not present

## 2018-01-27 DIAGNOSIS — S0091XA Abrasion of unspecified part of head, initial encounter: Secondary | ICD-10-CM | POA: Diagnosis not present

## 2018-01-27 DIAGNOSIS — R7301 Impaired fasting glucose: Secondary | ICD-10-CM | POA: Diagnosis not present

## 2018-01-27 DIAGNOSIS — S80219A Abrasion, unspecified knee, initial encounter: Secondary | ICD-10-CM | POA: Diagnosis not present

## 2018-02-17 DIAGNOSIS — Z952 Presence of prosthetic heart valve: Secondary | ICD-10-CM | POA: Diagnosis not present

## 2018-02-17 DIAGNOSIS — E875 Hyperkalemia: Secondary | ICD-10-CM | POA: Diagnosis not present

## 2018-02-17 DIAGNOSIS — C9110 Chronic lymphocytic leukemia of B-cell type not having achieved remission: Secondary | ICD-10-CM | POA: Diagnosis not present

## 2018-02-17 DIAGNOSIS — Z9181 History of falling: Secondary | ICD-10-CM | POA: Diagnosis not present

## 2018-02-17 DIAGNOSIS — R42 Dizziness and giddiness: Secondary | ICD-10-CM | POA: Diagnosis not present

## 2018-02-17 DIAGNOSIS — Z953 Presence of xenogenic heart valve: Secondary | ICD-10-CM | POA: Diagnosis not present

## 2018-02-17 DIAGNOSIS — C9111 Chronic lymphocytic leukemia of B-cell type in remission: Secondary | ICD-10-CM | POA: Diagnosis not present

## 2018-02-17 DIAGNOSIS — Z7982 Long term (current) use of aspirin: Secondary | ICD-10-CM | POA: Diagnosis not present

## 2018-05-03 DIAGNOSIS — Z0000 Encounter for general adult medical examination without abnormal findings: Secondary | ICD-10-CM | POA: Diagnosis not present

## 2018-05-03 DIAGNOSIS — Z23 Encounter for immunization: Secondary | ICD-10-CM | POA: Diagnosis not present

## 2018-05-03 DIAGNOSIS — Z6828 Body mass index (BMI) 28.0-28.9, adult: Secondary | ICD-10-CM | POA: Diagnosis not present

## 2018-06-17 ENCOUNTER — Other Ambulatory Visit: Payer: Self-pay

## 2018-08-08 DIAGNOSIS — R7301 Impaired fasting glucose: Secondary | ICD-10-CM | POA: Diagnosis not present

## 2018-08-08 DIAGNOSIS — Z6828 Body mass index (BMI) 28.0-28.9, adult: Secondary | ICD-10-CM | POA: Diagnosis not present

## 2018-08-08 DIAGNOSIS — C9511 Chronic leukemia of unspecified cell type, in remission: Secondary | ICD-10-CM | POA: Diagnosis not present

## 2018-08-08 DIAGNOSIS — D696 Thrombocytopenia, unspecified: Secondary | ICD-10-CM | POA: Diagnosis not present

## 2018-08-11 DIAGNOSIS — R7301 Impaired fasting glucose: Secondary | ICD-10-CM | POA: Diagnosis not present

## 2018-08-11 DIAGNOSIS — D696 Thrombocytopenia, unspecified: Secondary | ICD-10-CM | POA: Diagnosis not present

## 2018-08-11 DIAGNOSIS — Z953 Presence of xenogenic heart valve: Secondary | ICD-10-CM | POA: Diagnosis not present

## 2018-08-11 DIAGNOSIS — C9511 Chronic leukemia of unspecified cell type, in remission: Secondary | ICD-10-CM | POA: Diagnosis not present

## 2018-08-11 DIAGNOSIS — D179 Benign lipomatous neoplasm, unspecified: Secondary | ICD-10-CM | POA: Diagnosis not present

## 2018-08-11 DIAGNOSIS — R42 Dizziness and giddiness: Secondary | ICD-10-CM | POA: Diagnosis not present

## 2018-08-11 DIAGNOSIS — Z9181 History of falling: Secondary | ICD-10-CM | POA: Diagnosis not present

## 2018-08-11 DIAGNOSIS — Z8774 Personal history of (corrected) congenital malformations of heart and circulatory system: Secondary | ICD-10-CM | POA: Diagnosis not present

## 2018-08-25 DIAGNOSIS — L820 Inflamed seborrheic keratosis: Secondary | ICD-10-CM | POA: Diagnosis not present

## 2018-08-25 DIAGNOSIS — L304 Erythema intertrigo: Secondary | ICD-10-CM | POA: Diagnosis not present

## 2018-09-29 DIAGNOSIS — C4321 Malignant melanoma of right ear and external auricular canal: Secondary | ICD-10-CM | POA: Diagnosis not present

## 2018-09-29 DIAGNOSIS — I781 Nevus, non-neoplastic: Secondary | ICD-10-CM | POA: Diagnosis not present

## 2018-09-29 DIAGNOSIS — D0321 Melanoma in situ of right ear and external auricular canal: Secondary | ICD-10-CM | POA: Diagnosis not present

## 2018-09-29 DIAGNOSIS — L820 Inflamed seborrheic keratosis: Secondary | ICD-10-CM | POA: Diagnosis not present

## 2018-09-29 DIAGNOSIS — D225 Melanocytic nevi of trunk: Secondary | ICD-10-CM | POA: Diagnosis not present

## 2018-10-20 DIAGNOSIS — C4321 Malignant melanoma of right ear and external auricular canal: Secondary | ICD-10-CM | POA: Diagnosis not present

## 2019-01-02 ENCOUNTER — Ambulatory Visit: Payer: Medicare Other | Admitting: Cardiology

## 2019-08-26 DIAGNOSIS — Z23 Encounter for immunization: Secondary | ICD-10-CM | POA: Diagnosis not present

## 2019-09-25 DIAGNOSIS — Z23 Encounter for immunization: Secondary | ICD-10-CM | POA: Diagnosis not present

## 2020-06-20 DIAGNOSIS — Z23 Encounter for immunization: Secondary | ICD-10-CM | POA: Diagnosis not present

## 2020-07-18 DIAGNOSIS — Z23 Encounter for immunization: Secondary | ICD-10-CM | POA: Diagnosis not present

## 2021-05-08 DIAGNOSIS — H353130 Nonexudative age-related macular degeneration, bilateral, stage unspecified: Secondary | ICD-10-CM | POA: Diagnosis not present

## 2021-05-08 DIAGNOSIS — H52223 Regular astigmatism, bilateral: Secondary | ICD-10-CM | POA: Diagnosis not present

## 2021-05-08 DIAGNOSIS — Z961 Presence of intraocular lens: Secondary | ICD-10-CM | POA: Diagnosis not present

## 2021-05-08 DIAGNOSIS — H524 Presbyopia: Secondary | ICD-10-CM | POA: Diagnosis not present

## 2021-05-08 DIAGNOSIS — H5203 Hypermetropia, bilateral: Secondary | ICD-10-CM | POA: Diagnosis not present

## 2021-06-04 DIAGNOSIS — Z23 Encounter for immunization: Secondary | ICD-10-CM | POA: Diagnosis not present

## 2022-03-15 ENCOUNTER — Inpatient Hospital Stay (HOSPITAL_COMMUNITY)
Admission: EM | Admit: 2022-03-15 | Discharge: 2022-03-19 | DRG: 480 | Disposition: A | Discharge status: Skilled Nursing Facility | Payer: Medicare Other | Attending: Family Medicine | Admitting: Family Medicine

## 2022-03-15 ENCOUNTER — Inpatient Hospital Stay (HOSPITAL_COMMUNITY): Payer: Medicare Other

## 2022-03-15 ENCOUNTER — Emergency Department (HOSPITAL_COMMUNITY): Payer: Medicare Other

## 2022-03-15 ENCOUNTER — Other Ambulatory Visit: Payer: Self-pay

## 2022-03-15 ENCOUNTER — Encounter (HOSPITAL_COMMUNITY): Payer: Self-pay

## 2022-03-15 DIAGNOSIS — I444 Left anterior fascicular block: Secondary | ICD-10-CM | POA: Diagnosis present

## 2022-03-15 DIAGNOSIS — D62 Acute posthemorrhagic anemia: Secondary | ICD-10-CM | POA: Diagnosis present

## 2022-03-15 DIAGNOSIS — D696 Thrombocytopenia, unspecified: Secondary | ICD-10-CM | POA: Diagnosis present

## 2022-03-15 DIAGNOSIS — C9110 Chronic lymphocytic leukemia of B-cell type not having achieved remission: Secondary | ICD-10-CM | POA: Diagnosis present

## 2022-03-15 DIAGNOSIS — Z952 Presence of prosthetic heart valve: Secondary | ICD-10-CM | POA: Diagnosis not present

## 2022-03-15 DIAGNOSIS — Z8052 Family history of malignant neoplasm of bladder: Secondary | ICD-10-CM | POA: Diagnosis not present

## 2022-03-15 DIAGNOSIS — Z79899 Other long term (current) drug therapy: Secondary | ICD-10-CM | POA: Diagnosis not present

## 2022-03-15 DIAGNOSIS — E43 Unspecified severe protein-calorie malnutrition: Secondary | ICD-10-CM | POA: Diagnosis present

## 2022-03-15 DIAGNOSIS — Z8042 Family history of malignant neoplasm of prostate: Secondary | ICD-10-CM | POA: Diagnosis not present

## 2022-03-15 DIAGNOSIS — Z87891 Personal history of nicotine dependence: Secondary | ICD-10-CM | POA: Diagnosis not present

## 2022-03-15 DIAGNOSIS — C9111 Chronic lymphocytic leukemia of B-cell type in remission: Secondary | ICD-10-CM | POA: Diagnosis present

## 2022-03-15 DIAGNOSIS — W19XXXA Unspecified fall, initial encounter: Secondary | ICD-10-CM

## 2022-03-15 DIAGNOSIS — W010XXA Fall on same level from slipping, tripping and stumbling without subsequent striking against object, initial encounter: Secondary | ICD-10-CM | POA: Diagnosis present

## 2022-03-15 DIAGNOSIS — I4891 Unspecified atrial fibrillation: Secondary | ICD-10-CM | POA: Diagnosis present

## 2022-03-15 DIAGNOSIS — D649 Anemia, unspecified: Secondary | ICD-10-CM

## 2022-03-15 DIAGNOSIS — S72009A Fracture of unspecified part of neck of unspecified femur, initial encounter for closed fracture: Secondary | ICD-10-CM

## 2022-03-15 DIAGNOSIS — I959 Hypotension, unspecified: Secondary | ICD-10-CM | POA: Diagnosis not present

## 2022-03-15 DIAGNOSIS — Z6820 Body mass index (BMI) 20.0-20.9, adult: Secondary | ICD-10-CM | POA: Diagnosis not present

## 2022-03-15 DIAGNOSIS — S72002A Fracture of unspecified part of neck of left femur, initial encounter for closed fracture: Principal | ICD-10-CM

## 2022-03-15 DIAGNOSIS — Z7982 Long term (current) use of aspirin: Secondary | ICD-10-CM | POA: Diagnosis not present

## 2022-03-15 DIAGNOSIS — S72142A Displaced intertrochanteric fracture of left femur, initial encounter for closed fracture: Principal | ICD-10-CM | POA: Diagnosis present

## 2022-03-15 LAB — CBC WITH DIFFERENTIAL/PLATELET
Abs Immature Granulocytes: 0.05 10*3/uL (ref 0.00–0.07)
Basophils Absolute: 0 10*3/uL (ref 0.0–0.1)
Basophils Relative: 0 %
Eosinophils Absolute: 0 10*3/uL (ref 0.0–0.5)
Eosinophils Relative: 0 %
HCT: 36.9 % — ABNORMAL LOW (ref 39.0–52.0)
Hemoglobin: 12.6 g/dL — ABNORMAL LOW (ref 13.0–17.0)
Immature Granulocytes: 1 %
Lymphocytes Relative: 8 %
Lymphs Abs: 0.8 10*3/uL (ref 0.7–4.0)
MCH: 32.2 pg (ref 26.0–34.0)
MCHC: 34.1 g/dL (ref 30.0–36.0)
MCV: 94.4 fL (ref 80.0–100.0)
Monocytes Absolute: 0.6 10*3/uL (ref 0.1–1.0)
Monocytes Relative: 6 %
Neutro Abs: 8.4 10*3/uL — ABNORMAL HIGH (ref 1.7–7.7)
Neutrophils Relative %: 85 %
Platelets: 140 10*3/uL — ABNORMAL LOW (ref 150–400)
RBC: 3.91 MIL/uL — ABNORMAL LOW (ref 4.22–5.81)
RDW: 12.8 % (ref 11.5–15.5)
WBC: 9.8 10*3/uL (ref 4.0–10.5)
nRBC: 0 % (ref 0.0–0.2)

## 2022-03-15 LAB — BASIC METABOLIC PANEL WITH GFR
Anion gap: 6 (ref 5–15)
BUN: 19 mg/dL (ref 8–23)
CO2: 28 mmol/L (ref 22–32)
Calcium: 8.6 mg/dL — ABNORMAL LOW (ref 8.9–10.3)
Chloride: 102 mmol/L (ref 98–111)
Creatinine, Ser: 0.7 mg/dL (ref 0.61–1.24)
GFR, Estimated: >60 mL/min
Glucose, Bld: 142 mg/dL — ABNORMAL HIGH (ref 70–99)
Potassium: 3.6 mmol/L (ref 3.5–5.1)
Sodium: 136 mmol/L (ref 135–145)

## 2022-03-15 LAB — SURGICAL PCR SCREEN
MRSA, PCR: NEGATIVE
Staphylococcus aureus: NEGATIVE

## 2022-03-15 LAB — PROTIME-INR
INR: 1.3 — ABNORMAL HIGH (ref 0.8–1.2)
Prothrombin Time: 15.9 seconds — ABNORMAL HIGH (ref 11.4–15.2)

## 2022-03-15 MED ORDER — TRAZODONE HCL 50 MG PO TABS: 50.0000 mg | ORAL_TABLET | Freq: Every evening | ORAL | Status: DC | PRN

## 2022-03-15 MED ORDER — ONDANSETRON HCL 4 MG PO TABS: 4.0000 mg | ORAL_TABLET | Freq: Four times a day (QID) | ORAL | Status: DC | PRN

## 2022-03-15 MED ORDER — POLYETHYLENE GLYCOL 3350 17 G PO PACK: 17.0000 g | PACK | Freq: Every day | ORAL | Status: DC | PRN

## 2022-03-15 MED ORDER — ACETAMINOPHEN 325 MG PO TABS
650.0000 mg | ORAL_TABLET | Freq: Four times a day (QID) | ORAL | Status: DC | PRN
  Administered 2022-03-18 – 2022-03-19 (×3): 650 mg via ORAL
  Filled 2022-03-15 (×3): qty 2

## 2022-03-15 MED ORDER — MORPHINE SULFATE (PF) 4 MG/ML IV SOLN
4.0000 mg | Freq: Once | INTRAVENOUS | Status: AC
  Administered 2022-03-15: 4 mg via INTRAVENOUS
  Filled 2022-03-15: qty 1

## 2022-03-15 MED ORDER — METHOCARBAMOL 500 MG PO TABS
500.0000 mg | ORAL_TABLET | Freq: Three times a day (TID) | ORAL | Status: DC
Start: 2022-03-15 — End: 2022-03-19
  Administered 2022-03-15 – 2022-03-19 (×9): 500 mg via ORAL
  Filled 2022-03-15 (×9): qty 1

## 2022-03-15 MED ORDER — ADULT MULTIVITAMIN W/MINERALS CH
ORAL_TABLET | Freq: Every day | ORAL | Status: DC
  Administered 2022-03-15 – 2022-03-19 (×4): 1 via ORAL
  Filled 2022-03-15 (×4): qty 1

## 2022-03-15 MED ORDER — FENTANYL CITRATE PF 50 MCG/ML IJ SOSY
25.0000 ug | PREFILLED_SYRINGE | INTRAMUSCULAR | Status: DC | PRN
  Administered 2022-03-16 – 2022-03-17 (×3): 25 ug via INTRAVENOUS
  Filled 2022-03-15 (×3): qty 1

## 2022-03-15 MED ORDER — HEPARIN SODIUM (PORCINE) 5000 UNIT/ML IJ SOLN: 5000.0000 [IU] | Freq: Three times a day (TID) | INTRAMUSCULAR | Status: DC

## 2022-03-15 MED ORDER — SODIUM CHLORIDE 0.9% FLUSH
3.0000 mL | Freq: Two times a day (BID) | INTRAVENOUS | Status: DC
Start: 2022-03-15 — End: 2022-03-19
  Administered 2022-03-15 – 2022-03-19 (×8): 3 mL via INTRAVENOUS

## 2022-03-15 MED ORDER — FENTANYL CITRATE PF 50 MCG/ML IJ SOSY
25.0000 ug | PREFILLED_SYRINGE | Freq: Once | INTRAMUSCULAR | Status: AC
  Administered 2022-03-15: 25 ug via INTRAVENOUS
  Filled 2022-03-15: qty 1

## 2022-03-15 MED ORDER — SENNOSIDES-DOCUSATE SODIUM 8.6-50 MG PO TABS
2.0000 | ORAL_TABLET | Freq: Two times a day (BID) | ORAL | Status: DC
  Administered 2022-03-15 – 2022-03-19 (×7): 2 via ORAL
  Filled 2022-03-15 (×7): qty 2

## 2022-03-15 MED ORDER — SODIUM CHLORIDE 0.9 % IV SOLN
INTRAVENOUS | Status: DC | PRN
Start: 2022-03-15 — End: 2022-03-19

## 2022-03-15 MED ORDER — OXYCODONE HCL 5 MG PO TABS
5.0000 mg | ORAL_TABLET | ORAL | Status: DC | PRN
  Administered 2022-03-15 – 2022-03-17 (×2): 5 mg via ORAL
  Filled 2022-03-15 (×2): qty 1

## 2022-03-15 MED ORDER — POLYVINYL ALCOHOL 1.4 % OP SOLN
1.0000 [drp] | Freq: Every morning | OPHTHALMIC | Status: DC
  Administered 2022-03-15 – 2022-03-19 (×2): 1 [drp] via OPHTHALMIC
  Filled 2022-03-15 (×2): qty 15

## 2022-03-15 MED ORDER — BISACODYL 10 MG RE SUPP: 10.0000 mg | Freq: Every day | RECTAL | Status: DC | PRN

## 2022-03-15 MED ORDER — ACETAMINOPHEN 650 MG RE SUPP: 650.0000 mg | Freq: Four times a day (QID) | RECTAL | Status: DC | PRN

## 2022-03-15 MED ORDER — SODIUM CHLORIDE 0.9% FLUSH: 3.0000 mL | INTRAVENOUS | Status: DC | PRN

## 2022-03-15 MED ORDER — ENSURE ENLIVE PO LIQD
237.0000 mL | Freq: Two times a day (BID) | ORAL | Status: DC
  Administered 2022-03-15 – 2022-03-19 (×7): 237 mL via ORAL

## 2022-03-15 MED ORDER — ONDANSETRON HCL 4 MG/2ML IJ SOLN: 4.0000 mg | Freq: Four times a day (QID) | INTRAMUSCULAR | Status: DC | PRN

## 2022-03-15 MED ORDER — ONDANSETRON HCL 4 MG/2ML IJ SOLN
4.0000 mg | Freq: Once | INTRAMUSCULAR | Status: AC
  Administered 2022-03-15: 4 mg via INTRAVENOUS
  Filled 2022-03-15: qty 2

## 2022-03-15 MED ORDER — ASPIRIN 81 MG PO TBEC
81.0000 mg | DELAYED_RELEASE_TABLET | Freq: Every day | ORAL | Status: DC
  Administered 2022-03-15 – 2022-03-19 (×4): 81 mg via ORAL
  Filled 2022-03-15 (×4): qty 1

## 2022-03-15 MED ORDER — SODIUM CHLORIDE 0.9% FLUSH
3.0000 mL | Freq: Two times a day (BID) | INTRAVENOUS | Status: DC
Start: 2022-03-15 — End: 2022-03-19
  Administered 2022-03-15 – 2022-03-19 (×5): 3 mL via INTRAVENOUS

## 2022-03-15 NOTE — Plan of Care (Signed)
  Problem: Education: Goal: Knowledge of General Education information will improve Description: Including pain rating scale, medication(s)/side effects and non-pharmacologic comfort measures Outcome: Not Progressing   Problem: Health Behavior/Discharge Planning: Goal: Ability to manage health-related needs will improve Outcome: Not Progressing   Problem: Clinical Measurements: Goal: Ability to maintain clinical measurements within normal limits will improve Outcome: Not Progressing Goal: Will remain free from infection Outcome: Not Progressing Goal: Diagnostic test results will improve Outcome: Not Progressing Goal: Respiratory complications will improve Outcome: Not Progressing Goal: Cardiovascular complication will be avoided Outcome: Not Progressing   Problem: Coping: Goal: Level of anxiety will decrease Outcome: Not Progressing   Problem: Elimination: Goal: Will not experience complications related to bowel motility Outcome: Not Progressing Goal: Will not experience complications related to urinary retention Outcome: Not Progressing   Problem: Pain Managment: Goal: General experience of comfort will improve Outcome: Not Progressing

## 2022-03-15 NOTE — H&P (Addendum)
Patient Demographics:    Jimmy Russell, is a 88 y.o. male  MRN: [id number]   DOB - 02-21-1920  Admit Date - 03/15/2022  Outpatient Primary MD for the patient is Celene Squibb, MD   Assessment & Plan:   Assessment and Plan:  1)Lt Hip Fx-- Dr Katha Hamming the orthopedic surgeon recommends transfer to [facility] for orthopedic/operative intervention -Orthopedic surgeon also recommends buck's traction which is not available here at [facility] -As needed opiates for pain control- muscle relaxants as ordered  2)CLL (chronic lymphocytic leukemia) with Richter's Transformation -in Remission -Stable, asymptomatic -WBC WNL  3)Mild anemia and Thrombocytopenia--- no recent values available for comparison so unable to tell if anemia is acute or chronic -Hgb currently 12.6 -Platelets 140 -Patient appears to have prior history of thrombocytopenia--- -No Bleeding concerns at this time monitor closely  4)Preop--- patient is almost 88 years old, patient and family understands increased perioperative risk given advanced age -Chest x-ray without acute findings -EKG shows irregular rhythm with PVCs and left anterior fascicular block -Repeat EKG in a.m.  Disposition/Need for in-Hospital Stay- patient unable to be discharged at this time due to -- -Dr Katha Hamming the orthopedic surgeon recommends transfer to [facility] for orthopedic/operative intervention  Status is: Inpatient  Remains inpatient appropriate because:   Dispo: The patient is from: Home              Anticipated d/c is to: SNF              Anticipated d/c date is: 2 days              Patient currently is not medically stable to d/c. Barriers: Not Clinically Stable-   With History of - Reviewed by me  Past Medical History:  Diagnosis Date   CLL  (chronic lymphoblastic leukemia)    CLL (chronic lymphocytic leukemia) with Richter's Transformation  09/02/2011   Port catheter in place 09/12/2012      Past Surgical History:  Procedure Laterality Date   AORTIC VALVE REPLACEMENT     APPENDECTOMY      Chief Complaint  Patient presents with   Fall    Left hip deformity      HPI:    Jimmy Russell  is a 88 y.o. male with past medical history relevant for thrombocytopenia,CLL (chronic lymphocytic leukemia) with Richter's Transformation -in Remission,-Prior valve surgery with bovine replacement who presents to the ED after mechanical fall at home with left hip pain after the fall -Denies loss of consciousness chest pains palpitations or dizziness before after fall,, no concerns about seizures -- Apparently he was not using his walker -Additional history obtained from patient's son and daughter-in-law at bedside -No concerns about head injury -Cognitively according to patient's son and daughter-in-law patient appears to be at baseline -Hgb 12.6 -WBC 9.8.Marland Kitchen   -Platelets 140, INR 1.3 -Patient appears to have prior history of thrombocytopenia--- -No Bleeding concerns at this  time monitor closely -EKG shows irregular rhythm with PVCs and left anterior fascicular block -Chest x-ray without acute findings -Hip Xrays--Comminuted left femur intertrochanteric fracture with varus impaction. -EDP discussed case with on-call orthopedic surgeon at [facility] who recommended transfer to [facility] for operative/orthopedic intervention   Review of systems:    In addition to the HPI above,   A full Review of  Systems was done, all other systems reviewed are negative except as noted above in HPI , .    Social History:  Reviewed by me    Social History   Tobacco Use   Smoking status: Former   Smokeless tobacco: Never  Substance Use Topics   Alcohol use: Never    Alcohol/week: 0.0 standard drinks of alcohol       Family History :   Reviewed by me    Family History  Problem Relation Age of Onset   Cancer Brother        prostate cancer   Cancer Son        bladder cancer   Macular degeneration Sister     Home Medications:   Prior to Admission medications   Medication Sig Start Date End Date Taking? Authorizing Provider  aspirin EC 81 MG tablet Take 81 mg by mouth daily.   Yes [provider]  ENSURE (ENSURE) Take 237 mLs by mouth daily.   Yes [provider]  Hyprom-Naphaz-Polysorb-Zn Sulf (CLEAR EYES COMPLETE) SOLN Apply 1 drop to eye every morning.   Yes [provider]  Multiple Vitamins-Minerals (CENTRUM SILVER ADULT 50+ PO) Take 1 tablet by mouth daily.   Yes [provider]     Allergies:    No Known Allergies   Physical Exam:   Vitals  Blood pressure (!) 107/90, pulse (!) 59, temperature 98.2 F (36.8 C), temperature source Oral, resp. rate 15, height '5\' 7"'$  (1.702 m), weight 60.3 kg, SpO2 99 %.  Physical Examination: General appearance - alert,  in no distress  Mental status - alert, oriented to person, place, and time,  Eyes - sclera anicteric Neck - supple, no JVD elevation , Chest - clear  to auscultation bilaterally, symmetrical air movement,  Heart - S1 and S2 normal, irregular, prior sternotomy scar, 3/6 systolic murmur Abdomen - soft, nontender, nondistended, +BS Neurological - screening mental status exam normal, neck supple without rigidity, cranial nerves II through XII intact, DTR's normal and symmetric Extremities - no pedal edema noted, intact peripheral pulses , left lower extremity is rotated and shortened... Point tenderness on palpation over the lateral aspect of the left hip Skin - warm, dry     Data Review:    CBC Recent Labs  Lab 03/15/22 0734  WBC 9.8  HGB 12.6*  HCT 36.9*  PLT 140*  MCV 94.4  MCH 32.2  MCHC 34.1  RDW 12.8  LYMPHSABS 0.8  MONOABS 0.6  EOSABS 0.0  BASOSABS 0.0    ------------------------------------------------------------------------------------------------------------------  Chemistries  Recent Labs  Lab 03/15/22 0734  NA 136  K 3.6  CL 102  CO2 28  GLUCOSE 142*  BUN 19  CREATININE 0.70  CALCIUM 8.6*   ------------------------------------------------------------------------------------------------------------------ estimated creatinine clearance is 42.9 mL/min (by C-G formula based on SCr of 0.7 mg/dL). ------------------------------------------------------------------------------------------------------------------ No results for input(s): "TSH", "T4TOTAL", "T3FREE", "THYROIDAB" in the last 72 hours.  Invalid input(s): "FREET3"   Coagulation profile Recent Labs  Lab 03/15/22 0734  INR 1.3*   ------------------------------------------------------------------------------------------------------------------- No results for input(s): "DDIMER" in the last 72 hours. -------------------------------------------------------------------------------------------------------------------  Cardiac Enzymes No results for input(s): "CKMB", "TROPONINI", "MYOGLOBIN" in the last 168 hours.  Invalid input(s): "CK" ------------------------------------------------------------------------------------------------------------------ No results found for: "BNP"   ---------------------------------------------------------------------------------------------------------------  Urinalysis    Component Value Date/Time   COLORURINE YELLOW 11/08/2014 [place] 11/08/2014 1645   LABSPEC 1.015 11/08/2014 1645   PHURINE 6.0 11/08/2014 1645   GLUCOSEU NEGATIVE 11/08/2014 1645   HGBUR NEGATIVE 11/08/2014 [place] 11/08/2014 [place] 11/08/2014 1645   PROTEINUR NEGATIVE 11/08/2014 1645   UROBILINOGEN 0.2 11/08/2014 1645   NITRITE NEGATIVE 11/08/2014 1645   LEUKOCYTESUR NEGATIVE 11/08/2014 1645     ----------------------------------------------------------------------------------------------------------------   Imaging Results:    DG CHEST PORT 1 VIEW  Result Date: 03/15/2022 CLINICAL DATA:  Preoperative study EXAM: PORTABLE CHEST 1 VIEW COMPARISON:  November 08, 2014 FINDINGS: The right hemidiaphragm remains elevated with apparent atelectasis in the right base, stable. Stable right calcified nodule. No pneumothorax. No focal infiltrate. No change in the cardiomediastinal silhouette. IMPRESSION: No acute interval change or abnormality. Electronically Signed   By: Dorise Bullion III M.D.   On: 03/15/2022 09:44   DG Hip Unilat With Pelvis 2-3 Views Left  Result Date: 03/15/2022 CLINICAL DATA:  88 year old male status post fall. Left leg deformity. EXAM: DG HIP (WITH OR WITHOUT PELVIS) 2-3V LEFT; LEFT FEMUR 1 VIEW COMPARISON:  None Available. FINDINGS: Comminuted left femur intertrochanteric fracture with mild varus impaction. Butterfly fragment of the lesser trochanter. Left femoral head remains normally located. No superimposed acute fracture of the pelvis identified. Right femoral head normally located. Left femoral shaft and distal left femur appear intact. Calcified left femoral artery atherosclerosis. Negative visible bowel gas pattern. IMPRESSION: Comminuted left femur intertrochanteric fracture with varus impaction. Electronically Signed   By: Genevie Ann M.D.   On: 03/15/2022 08:37   DG Femur 1V Left  Result Date: 03/15/2022 CLINICAL DATA:  88 year old male status post fall. Left leg deformity. EXAM: DG HIP (WITH OR WITHOUT PELVIS) 2-3V LEFT; LEFT FEMUR 1 VIEW COMPARISON:  None Available. FINDINGS: Comminuted left femur intertrochanteric fracture with mild varus impaction. Butterfly fragment of the lesser trochanter. Left femoral head remains normally located. No superimposed acute fracture of the pelvis identified. Right femoral head normally located. Left femoral shaft and distal left  femur appear intact. Calcified left femoral artery atherosclerosis. Negative visible bowel gas pattern. IMPRESSION: Comminuted left femur intertrochanteric fracture with varus impaction. Electronically Signed   By: Genevie Ann M.D.   On: 03/15/2022 08:37    Radiological Exams on Admission: DG CHEST PORT 1 VIEW  Result Date: 03/15/2022 CLINICAL DATA:  Preoperative study EXAM: PORTABLE CHEST 1 VIEW COMPARISON:  November 08, 2014 FINDINGS: The right hemidiaphragm remains elevated with apparent atelectasis in the right base, stable. Stable right calcified nodule. No pneumothorax. No focal infiltrate. No change in the cardiomediastinal silhouette. IMPRESSION: No acute interval change or abnormality. Electronically Signed   By: Dorise Bullion III M.D.   On: 03/15/2022 09:44   DG Hip Unilat With Pelvis 2-3 Views Left  Result Date: 03/15/2022 CLINICAL DATA:  88 year old male status post fall. Left leg deformity. EXAM: DG HIP (WITH OR WITHOUT PELVIS) 2-3V LEFT; LEFT FEMUR 1 VIEW COMPARISON:  None Available. FINDINGS: Comminuted left femur intertrochanteric fracture with mild varus impaction. Butterfly fragment of the lesser trochanter. Left femoral head remains normally located. No superimposed acute fracture of the pelvis identified. Right femoral head normally located. Left femoral shaft and distal left femur appear intact. Calcified left femoral  artery atherosclerosis. Negative visible bowel gas pattern. IMPRESSION: Comminuted left femur intertrochanteric fracture with varus impaction. Electronically Signed   By: Genevie Ann M.D.   On: 03/15/2022 08:37   DG Femur 1V Left  Result Date: 03/15/2022 CLINICAL DATA:  88 year old male status post fall. Left leg deformity. EXAM: DG HIP (WITH OR WITHOUT PELVIS) 2-3V LEFT; LEFT FEMUR 1 VIEW COMPARISON:  None Available. FINDINGS: Comminuted left femur intertrochanteric fracture with mild varus impaction. Butterfly fragment of the lesser trochanter. Left femoral head remains  normally located. No superimposed acute fracture of the pelvis identified. Right femoral head normally located. Left femoral shaft and distal left femur appear intact. Calcified left femoral artery atherosclerosis. Negative visible bowel gas pattern. IMPRESSION: Comminuted left femur intertrochanteric fracture with varus impaction. Electronically Signed   By: Genevie Ann M.D.   On: 03/15/2022 08:37    DVT Prophylaxis -SCD /heparin AM Labs Ordered, also please review Full Orders  Family Communication: Admission, patients condition and plan of care including tests being ordered have been discussed with the patient and son and daughter-in-law at bedside who indicate understanding and agree with the plan   Condition  -stable  Roxan Hockey M.D on 03/15/2022 at 4:45 PM Go to [email or web address] -  for contact info  [facility] - Office  [phone]

## 2022-03-15 NOTE — ED Notes (Addendum)
Dr. Joesph Fillers at bedside and made aware that buck's traction is not available here at [place].

## 2022-03-15 NOTE — ED Provider Notes (Addendum)
[facility] EMERGENCY DEPARTMENT Provider Note  CSN: [id number] Arrival date & time: 03/15/22 1696  Chief Complaint(s) Fall (Left hip deformity)  HPI Jimmy Russell is a 88 y.o. male with history of CLL in remission, biologic valve replacement presenting after fall.  Patient reports that he was getting out of bed, walking with walker as normally, lost balance and fell to the left side landing on his left hip.  He reports severe left hip pain.  Denies numbness, tingling.  Has any nausea or vomiting.  Denies hitting his head, chest, abdomen, back or having pain in these areas.  He has not been able to ambulate or bear weight since the fall.  Patient was brought in by EMS.  The fall occurred today, this morning, just prior to arrival.   Past Medical History Past Medical History:  Diagnosis Date   CLL (chronic lymphoblastic leukemia)    CLL (chronic lymphocytic leukemia) with Richter's Transformation  09/02/2011   Port catheter in place 09/12/2012   Patient Active Problem List   Diagnosis Date Noted   Hip fracture ([place]) 03/15/2022   Port catheter in place 09/12/2012   CLL (chronic lymphocytic leukemia) with Richter's Transformation  09/02/2011   Home Medication(s) Prior to Admission medications   Medication Sig Start Date End Date Taking? Authorizing Provider  aspirin EC 81 MG tablet Take 81 mg by mouth daily.    [provider]  ENSURE (ENSURE) Take 237 mLs by mouth daily.    [provider]  Hyprom-Naphaz-Polysorb-Zn Sulf (CLEAR EYES COMPLETE) SOLN Apply 1 drop to eye every morning.    [provider]  Multiple Vitamins-Minerals (CENTRUM SILVER ADULT 50+ PO) Take 1 tablet by mouth daily.    [provider]                                                                                                                                    Past Surgical History Past Surgical History:  Procedure Laterality Date   AORTIC VALVE REPLACEMENT      APPENDECTOMY     Family History Family History  Problem Relation Age of Onset   Cancer Brother        prostate cancer   Cancer Son        bladder cancer   Macular degeneration Sister     Social History Social History   Tobacco Use   Smoking status: Former   Smokeless tobacco: Never  Scientific laboratory technician Use: Never used  Substance Use Topics   Alcohol use: Never    Alcohol/week: 0.0 standard drinks of alcohol   Drug use: Never   Allergies Patient has no known allergies.  Review of Systems Review of Systems  All other systems reviewed and are negative.   Physical Exam Vital Signs  I have reviewed the triage vital signs BP (!) 92/50   Pulse (!) 52   Temp 97.9 F (36.6 C) (  Oral)   Resp 13   Ht '5\' 7"'$  (1.702 m)   Wt 60.3 kg   SpO2 98%   BMI 20.83 kg/m  Physical Exam Vitals and nursing note reviewed.  Constitutional:      General: He is not in acute distress.    Appearance: Normal appearance.  HENT:     Head: Normocephalic and atraumatic.     Mouth/Throat:     Mouth: Mucous membranes are moist.  Neck:     Comments: No midline C, T, L-spine tenderness or step-off Cardiovascular:     Rate and Rhythm: Normal rate and regular rhythm.  Pulmonary:     Effort: Pulmonary effort is normal. No respiratory distress.     Breath sounds: Normal breath sounds.  Abdominal:     General: Abdomen is flat.     Palpations: Abdomen is soft.     Tenderness: There is no abdominal tenderness.  Musculoskeletal:     Comments: Obvious deformity to the left hip with shortened and internally rotated left leg.  No chest wall tenderness.  Intact range of motion of the right lower extremity, bilateral upper extremities without injury or deformity.  Skin:    General: Skin is warm and dry.     Capillary Refill: Capillary refill takes less than 2 seconds.  Neurological:     Mental Status: He is alert and oriented to person, place, and time. Mental status is at baseline.     Comments:  Distal neurovascular status intact in the left leg  Psychiatric:        Mood and Affect: Mood normal.        Behavior: Behavior normal.     ED Results and Treatments Labs (all labs ordered are listed, but only abnormal results are displayed) Labs Reviewed  BASIC METABOLIC PANEL - Abnormal; Notable for the following components:      Result Value   Glucose, Bld 142 (*)    Calcium 8.6 (*)    All other components within normal limits  CBC WITH DIFFERENTIAL/PLATELET - Abnormal; Notable for the following components:   RBC 3.91 (*)    Hemoglobin 12.6 (*)    HCT 36.9 (*)    Platelets 140 (*)    Neutro Abs 8.4 (*)    All other components within normal limits  PROTIME-INR - Abnormal; Notable for the following components:   Prothrombin Time 15.9 (*)    INR 1.3 (*)    All other components within normal limits                                                                                                                          Radiology DG Hip Unilat With Pelvis 2-3 Views Left  Result Date: 03/15/2022 CLINICAL DATA:  88 year old male status post fall. Left leg deformity. EXAM: DG HIP (WITH OR WITHOUT PELVIS) 2-3V LEFT; LEFT FEMUR 1 VIEW COMPARISON:  None Available. FINDINGS: Comminuted left femur intertrochanteric fracture with mild varus impaction. Butterfly fragment of  the lesser trochanter. Left femoral head remains normally located. No superimposed acute fracture of the pelvis identified. Right femoral head normally located. Left femoral shaft and distal left femur appear intact. Calcified left femoral artery atherosclerosis. Negative visible bowel gas pattern. IMPRESSION: Comminuted left femur intertrochanteric fracture with varus impaction. Electronically Signed   By: Genevie Ann M.D.   On: 03/15/2022 08:37   DG Femur 1V Left  Result Date: 03/15/2022 CLINICAL DATA:  88 year old male status post fall. Left leg deformity. EXAM: DG HIP (WITH OR WITHOUT PELVIS) 2-3V LEFT; LEFT FEMUR 1 VIEW  COMPARISON:  None Available. FINDINGS: Comminuted left femur intertrochanteric fracture with mild varus impaction. Butterfly fragment of the lesser trochanter. Left femoral head remains normally located. No superimposed acute fracture of the pelvis identified. Right femoral head normally located. Left femoral shaft and distal left femur appear intact. Calcified left femoral artery atherosclerosis. Negative visible bowel gas pattern. IMPRESSION: Comminuted left femur intertrochanteric fracture with varus impaction. Electronically Signed   By: Genevie Ann M.D.   On: 03/15/2022 08:37    Pertinent labs & imaging results that were available during my care of the patient were reviewed by me and considered in my medical decision making (see MDM for details).  Medications Ordered in ED Medications  morphine (PF) 4 MG/ML injection 4 mg (4 mg Intravenous Given 03/15/22 0722)  ondansetron (ZOFRAN) injection 4 mg (4 mg Intravenous Given 03/15/22 5597)                                                                                                                                     Procedures Procedures  (including critical care time)  Medical Decision Making / ED Course   MDM:  88 year old male presenting with left hip pain after fall.  Exam concerning for fracture of femur.  Will obtain x-rays of the femur, pelvis.  Will treat pain.  No sign of other traumatic injuries on exam including no sign of head injury, C, T, L-spine injury, abdominal injury, chest injury, or any injury to remainder of extremities.  Neurovascular exam reassuring.  Clinical Course as of 03/15/22 4163  Sun Mar 15, 2022  0920 Discussed with Dr. Doreatha Martin, recommends transfer to [facility] for surgery. Hospitalist has admitted patient. [WS]    Clinical Course User Index [WS] Cristie Hem, MD     Additional history obtained: -Additional history obtained from son -External records from outside source obtained and reviewed including:  Chart review including previous notes, labs, imaging, consultation notes   Lab Tests: -I ordered, reviewed, and interpreted labs.   The pertinent results include:   Labs Reviewed  BASIC METABOLIC PANEL - Abnormal; Notable for the following components:      Result Value   Glucose, Bld 142 (*)    Calcium 8.6 (*)    All other components within normal limits  CBC WITH DIFFERENTIAL/PLATELET - Abnormal; Notable for the following components:  RBC 3.91 (*)    Hemoglobin 12.6 (*)    HCT 36.9 (*)    Platelets 140 (*)    Neutro Abs 8.4 (*)    All other components within normal limits  PROTIME-INR - Abnormal; Notable for the following components:   Prothrombin Time 15.9 (*)    INR 1.3 (*)    All other components within normal limits      EKG   EKG Interpretation  Date/Time:    Ventricular Rate:    PR Interval:    QRS Duration:   QT Interval:    QTC Calculation:   R Axis:     Text Interpretation:           Imaging Studies ordered: I ordered imaging studies including XR pelvis and left hip, femur I independently visualized and interpreted imaging. I agree with the radiologist interpretation   Medicines ordered and prescription drug management: Meds ordered this encounter  Medications   morphine (PF) 4 MG/ML injection 4 mg   ondansetron (ZOFRAN) injection 4 mg    -I have reviewed the patients home medicines and have made adjustments as needed    Consultations Obtained: I requested consultation with the orthopedic surgeon,  and discussed lab and imaging findings as well as pertinent plan - they recommend: admission for surgery   Cardiac Monitoring: The patient was maintained on a cardiac monitor.  I personally viewed and interpreted the cardiac monitored which showed an underlying rhythm of: NSR  Social Determinants of Health:  Factors impacting patients care include: elderly, lives alone   Reevaluation: After the interventions noted above, I reevaluated the  patient and found that they have :improved  Co morbidities that complicate the patient evaluation  Past Medical History:  Diagnosis Date   CLL (chronic lymphoblastic leukemia)    CLL (chronic lymphocytic leukemia) with Richter's Transformation  09/02/2011   Port catheter in place 09/12/2012      Dispostion: I considered admission for this patient, and will admit given need for operative repair of his hip fracture     Final Clinical Impression(s) / ED Diagnoses Final diagnoses:  Closed fracture of left hip, initial encounter [facility])  Fall, initial encounter     This chart was dictated using voice recognition software.  Despite best efforts to proofread,  errors can occur which can change the documentation meaning.    Cristie Hem, MD 03/15/22 4034    Cristie Hem, MD 03/15/22 [phone]

## 2022-03-15 NOTE — Progress Notes (Signed)
Ortho Trauma Note  Received consult from Dr. Truett Mainland regarding this patient. 88 yo male with left intertrochanteric femur fracture. Will recommend hospitalist admission at [facility] with plans for surgery tomorrow AM. We will consult on patient once he arrives to [facility].  Shona Needles, MD [facility] [phone] (office) [email or web address]

## 2022-03-15 NOTE — Progress Notes (Signed)
Orthopedic Tech Progress Note Patient Details:  Jimmy Russell 04/25/1920 [id number]  Musculoskeletal Traction Type of Traction: Bucks Skin Traction Traction Location: lle Traction Weight: 10 lbs   Post Interventions Patient Tolerated: Well Instructions Provided: Adjustment of device, Care of device  Karolee Stamps 03/15/2022, 9:45 PM

## 2022-03-15 NOTE — ED Triage Notes (Signed)
Pt BIB [facility] for fall, pt from home, lives independently, son lives close by, pt uses walker, reports he had it close by this morning when he fell, per EMS pt got himself up and into chair after fall. Left hip has obvious deformity and leg is shortened and rotated. Pt had 25 mcg Fentanyl route by [facility]

## 2022-03-16 ENCOUNTER — Inpatient Hospital Stay (HOSPITAL_COMMUNITY): Payer: Medicare Other

## 2022-03-16 ENCOUNTER — Encounter (HOSPITAL_COMMUNITY)
Admission: EM | Disposition: A | Discharge status: Skilled Nursing Facility | Payer: Self-pay | Source: Home / Self Care | Attending: Family Medicine

## 2022-03-16 ENCOUNTER — Inpatient Hospital Stay (HOSPITAL_COMMUNITY): Payer: Medicare Other | Admitting: Certified Registered"

## 2022-03-16 ENCOUNTER — Encounter (HOSPITAL_COMMUNITY): Payer: Self-pay | Admitting: Family Medicine

## 2022-03-16 ENCOUNTER — Other Ambulatory Visit: Payer: Self-pay

## 2022-03-16 DIAGNOSIS — C9110 Chronic lymphocytic leukemia of B-cell type not having achieved remission: Secondary | ICD-10-CM | POA: Diagnosis not present

## 2022-03-16 DIAGNOSIS — S72142A Displaced intertrochanteric fracture of left femur, initial encounter for closed fracture: Secondary | ICD-10-CM

## 2022-03-16 DIAGNOSIS — S72002A Fracture of unspecified part of neck of left femur, initial encounter for closed fracture: Secondary | ICD-10-CM | POA: Diagnosis not present

## 2022-03-16 DIAGNOSIS — D696 Thrombocytopenia, unspecified: Secondary | ICD-10-CM

## 2022-03-16 DIAGNOSIS — I4891 Unspecified atrial fibrillation: Secondary | ICD-10-CM | POA: Diagnosis not present

## 2022-03-16 DIAGNOSIS — D649 Anemia, unspecified: Secondary | ICD-10-CM

## 2022-03-16 DIAGNOSIS — Z87891 Personal history of nicotine dependence: Secondary | ICD-10-CM | POA: Diagnosis not present

## 2022-03-16 DIAGNOSIS — I959 Hypotension, unspecified: Secondary | ICD-10-CM

## 2022-03-16 HISTORY — PX: INTRAMEDULLARY (IM) NAIL INTERTROCHANTERIC: SHX5875

## 2022-03-16 LAB — CBC
HCT: 31.1 % — ABNORMAL LOW (ref 39.0–52.0)
HCT: 27 % — ABNORMAL LOW (ref 39.0–52.0)
Hemoglobin: 10.8 g/dL — ABNORMAL LOW (ref 13.0–17.0)
Hemoglobin: 8.7 g/dL — ABNORMAL LOW (ref 13.0–17.0)
MCH: 32.4 pg (ref 26.0–34.0)
MCH: 32.6 pg (ref 26.0–34.0)
MCHC: 34.7 g/dL (ref 30.0–36.0)
MCHC: 32.2 g/dL (ref 30.0–36.0)
MCV: 93.4 fL (ref 80.0–100.0)
MCV: 101.1 fL — ABNORMAL HIGH (ref 80.0–100.0)
Platelets: 124 10*3/uL — ABNORMAL LOW (ref 150–400)
Platelets: 83 10*3/uL — ABNORMAL LOW (ref 150–400)
RBC: 3.33 MIL/uL — ABNORMAL LOW (ref 4.22–5.81)
RBC: 2.67 MIL/uL — ABNORMAL LOW (ref 4.22–5.81)
RDW: 12.8 % (ref 11.5–15.5)
RDW: 12.8 % (ref 11.5–15.5)
WBC: 9.3 10*3/uL (ref 4.0–10.5)
WBC: 8.2 10*3/uL (ref 4.0–10.5)
nRBC: 0 % (ref 0.0–0.2)
nRBC: 0 % (ref 0.0–0.2)

## 2022-03-16 SURGERY — FIXATION, FRACTURE, INTERTROCHANTERIC, WITH INTRAMEDULLARY ROD
Anesthesia: Spinal | Site: Hip | Laterality: Left

## 2022-03-16 MED ORDER — AMISULPRIDE (ANTIEMETIC) 5 MG/2ML IV SOLN: 10.0000 mg | Freq: Once | INTRAVENOUS | Status: DC | PRN

## 2022-03-16 MED ORDER — KETOROLAC TROMETHAMINE 30 MG/ML IJ SOLN
INTRAMUSCULAR | Status: AC
  Filled 2022-03-16: qty 1

## 2022-03-16 MED ORDER — PHENYLEPHRINE 80 MCG/ML (10ML) SYRINGE FOR IV PUSH (FOR BLOOD PRESSURE SUPPORT)
PREFILLED_SYRINGE | INTRAVENOUS | Status: DC | PRN
  Administered 2022-03-16 (×3): 160 ug via INTRAVENOUS

## 2022-03-16 MED ORDER — ALBUMIN HUMAN 5 % IV SOLN
12.5000 g | Freq: Once | INTRAVENOUS | Status: AC
  Administered 2022-03-16: 12.5 g via INTRAVENOUS

## 2022-03-16 MED ORDER — ALBUMIN HUMAN 5 % IV SOLN: INTRAVENOUS | Status: DC | PRN

## 2022-03-16 MED ORDER — FENTANYL CITRATE (PF) 250 MCG/5ML IJ SOLN
INTRAMUSCULAR | Status: DC | PRN
  Administered 2022-03-16: 25 ug via INTRAVENOUS

## 2022-03-16 MED ORDER — ROPIVACAINE HCL 5 MG/ML IJ SOLN
INTRAMUSCULAR | Status: DC | PRN
  Administered 2022-03-16: 25 mL

## 2022-03-16 MED ORDER — CEFAZOLIN SODIUM-DEXTROSE 2-4 GM/100ML-% IV SOLN
2.0000 g | Freq: Three times a day (TID) | INTRAVENOUS | Status: AC
  Administered 2022-03-16 – 2022-03-17 (×3): 2 g via INTRAVENOUS
  Filled 2022-03-16 (×3): qty 100

## 2022-03-16 MED ORDER — FENTANYL CITRATE (PF) 250 MCG/5ML IJ SOLN
INTRAMUSCULAR | Status: AC
  Filled 2022-03-16: qty 5

## 2022-03-16 MED ORDER — FENTANYL CITRATE (PF) 100 MCG/2ML IJ SOLN: 25.0000 ug | INTRAMUSCULAR | Status: DC | PRN

## 2022-03-16 MED ORDER — ONDANSETRON HCL 4 MG/2ML IJ SOLN
INTRAMUSCULAR | Status: AC
  Filled 2022-03-16: qty 2

## 2022-03-16 MED ORDER — LACTATED RINGERS IV SOLN: INTRAVENOUS | Status: DC | PRN

## 2022-03-16 MED ORDER — TRANEXAMIC ACID-NACL 1000-0.7 MG/100ML-% IV SOLN
INTRAVENOUS | Status: AC
  Filled 2022-03-16: qty 100

## 2022-03-16 MED ORDER — DEXAMETHASONE SODIUM PHOSPHATE 10 MG/ML IJ SOLN
INTRAMUSCULAR | Status: AC
  Filled 2022-03-16: qty 1

## 2022-03-16 MED ORDER — ACETAMINOPHEN 500 MG PO TABS: 1000.0000 mg | ORAL_TABLET | Freq: Once | ORAL | Status: DC

## 2022-03-16 MED ORDER — EPHEDRINE SULFATE (PRESSORS) 50 MG/ML IJ SOLN
INTRAMUSCULAR | Status: DC | PRN
  Administered 2022-03-16: 2.5 mg via INTRAVENOUS
  Administered 2022-03-16: 5 mg via INTRAVENOUS
  Administered 2022-03-16: 10 mg via INTRAVENOUS
  Administered 2022-03-16: 2.5 mg via INTRAVENOUS
  Administered 2022-03-16: 5 mg via INTRAVENOUS

## 2022-03-16 MED ORDER — BUPIVACAINE IN DEXTROSE 0.75-8.25 % IT SOLN
INTRATHECAL | Status: DC | PRN
  Administered 2022-03-16: 1.2 mL via INTRATHECAL

## 2022-03-16 MED ORDER — ORAL CARE MOUTH RINSE: 15.0000 mL | Freq: Once | OROMUCOSAL | Status: AC

## 2022-03-16 MED ORDER — CHLORHEXIDINE GLUCONATE 0.12 % MT SOLN
OROMUCOSAL | Status: AC
  Filled 2022-03-16: qty 15

## 2022-03-16 MED ORDER — POLYETHYLENE GLYCOL 3350 17 G PO PACK
17.0000 g | PACK | Freq: Every day | ORAL | Status: DC
  Administered 2022-03-17 – 2022-03-19 (×3): 17 g via ORAL
  Filled 2022-03-16 (×3): qty 1

## 2022-03-16 MED ORDER — CEFAZOLIN SODIUM 1 G IJ SOLR
INTRAMUSCULAR | Status: AC
Start: 2022-03-16 — End: ?
  Filled 2022-03-16: qty 20

## 2022-03-16 MED ORDER — PROPOFOL 1000 MG/100ML IV EMUL
INTRAVENOUS | Status: AC
Start: 2022-03-16 — End: ?
  Filled 2022-03-16: qty 100

## 2022-03-16 MED ORDER — LACTATED RINGERS IV SOLN: INTRAVENOUS | Status: DC

## 2022-03-16 MED ORDER — CEFAZOLIN SODIUM-DEXTROSE 2-3 GM-%(50ML) IV SOLR
INTRAVENOUS | Status: DC | PRN
  Administered 2022-03-16: 2 g via INTRAVENOUS

## 2022-03-16 MED ORDER — ALBUMIN HUMAN 5 % IV SOLN
INTRAVENOUS | Status: AC
  Filled 2022-03-16: qty 250

## 2022-03-16 MED ORDER — CLONIDINE HCL (ANALGESIA) 100 MCG/ML EP SOLN
EPIDURAL | Status: DC | PRN
  Administered 2022-03-16: 50 ug

## 2022-03-16 MED ORDER — CHLORHEXIDINE GLUCONATE 0.12 % MT SOLN: 15.0000 mL | Freq: Once | OROMUCOSAL | Status: AC

## 2022-03-16 MED ORDER — LIDOCAINE 2% (20 MG/ML) 5 ML SYRINGE
INTRAMUSCULAR | Status: AC
  Filled 2022-03-16: qty 5

## 2022-03-16 MED ORDER — TRANEXAMIC ACID-NACL 1000-0.7 MG/100ML-% IV SOLN
INTRAVENOUS | Status: DC | PRN
  Administered 2022-03-16: 1000 mg via INTRAVENOUS

## 2022-03-16 MED ORDER — 0.9 % SODIUM CHLORIDE (POUR BTL) OPTIME
TOPICAL | Status: DC | PRN
  Administered 2022-03-16: 1000 mL

## 2022-03-16 MED ORDER — ENOXAPARIN SODIUM 40 MG/0.4ML IJ SOSY
40.0000 mg | PREFILLED_SYRINGE | INTRAMUSCULAR | Status: DC
  Administered 2022-03-17 – 2022-03-19 (×3): 40 mg via SUBCUTANEOUS
  Filled 2022-03-16 (×3): qty 0.4

## 2022-03-16 MED ORDER — PROPOFOL 10 MG/ML IV BOLUS
INTRAVENOUS | Status: DC | PRN
  Administered 2022-03-16: 20 mg via INTRAVENOUS

## 2022-03-16 MED ORDER — PROPOFOL 10 MG/ML IV BOLUS
INTRAVENOUS | Status: AC
  Filled 2022-03-16: qty 20

## 2022-03-16 MED ORDER — CHLORHEXIDINE GLUCONATE 0.12 % MT SOLN
OROMUCOSAL | Status: AC
  Administered 2022-03-16: 15 mL via OROMUCOSAL
  Filled 2022-03-16: qty 15

## 2022-03-16 MED ORDER — SODIUM CHLORIDE 0.9 % IV SOLN
25.0000 ug/min | INTRAVENOUS | Status: DC
  Administered 2022-03-16: 60 ug/min via INTRAVENOUS

## 2022-03-16 MED ORDER — PHENYLEPHRINE HCL-NACL 20-0.9 MG/250ML-% IV SOLN
INTRAVENOUS | Status: DC | PRN
  Administered 2022-03-16: 25 ug/min via INTRAVENOUS

## 2022-03-16 MED ORDER — ROCURONIUM BROMIDE 10 MG/ML (PF) SYRINGE
PREFILLED_SYRINGE | INTRAVENOUS | Status: AC
  Filled 2022-03-16: qty 10

## 2022-03-16 MED ORDER — PHENYLEPHRINE 80 MCG/ML (10ML) SYRINGE FOR IV PUSH (FOR BLOOD PRESSURE SUPPORT)
PREFILLED_SYRINGE | INTRAVENOUS | Status: AC
Start: 2022-03-16 — End: ?
  Filled 2022-03-16: qty 10

## 2022-03-16 MED ORDER — SODIUM CHLORIDE 0.9 % IV SOLN: 250.0000 mL | INTRAVENOUS | Status: DC

## 2022-03-16 MED ORDER — EPHEDRINE 5 MG/ML INJ
INTRAVENOUS | Status: AC
  Filled 2022-03-16: qty 5

## 2022-03-16 MED ORDER — FENTANYL CITRATE (PF) 100 MCG/2ML IJ SOLN
INTRAMUSCULAR | Status: AC
  Filled 2022-03-16: qty 2

## 2022-03-16 SURGICAL SUPPLY — 52 items
BIT DRILL INTERTAN LAG SCREW (BIT) ×1 IMPLANT
BIT DRILL LONG 4.0 (BIT) IMPLANT
BIT DRILL SHORT 4.0 (BIT) IMPLANT
BRUSH SCRUB EZ PLAIN DRY (MISCELLANEOUS) ×3 IMPLANT
CHLORAPREP W/TINT 26 (MISCELLANEOUS) ×2 IMPLANT
COVER PERINEAL POST (MISCELLANEOUS) ×2 IMPLANT
COVER SURGICAL LIGHT HANDLE (MISCELLANEOUS) ×2 IMPLANT
DERMABOND ADHESIVE PROPEN (GAUZE/BANDAGES/DRESSINGS) ×1
DERMABOND ADVANCED (GAUZE/BANDAGES/DRESSINGS) ×1
DERMABOND ADVANCED .7 DNX12 (GAUZE/BANDAGES/DRESSINGS) ×1 IMPLANT
DERMABOND ADVANCED .7 DNX6 (GAUZE/BANDAGES/DRESSINGS) IMPLANT
DRAPE C-ARM 35X43 STRL (DRAPES) ×2 IMPLANT
DRAPE IMP U-DRAPE 54X76 (DRAPES) ×4 IMPLANT
DRAPE INCISE IOBAN 66X45 STRL (DRAPES) ×2 IMPLANT
DRAPE STERI IOBAN 125X83 (DRAPES) ×2 IMPLANT
DRAPE SURG 17X23 STRL (DRAPES) ×4 IMPLANT
DRAPE U-SHAPE 47X51 STRL (DRAPES) ×2 IMPLANT
DRESSING MEPILEX FLEX 4X4 (GAUZE/BANDAGES/DRESSINGS) IMPLANT
DRILL BIT LONG 4.0 (BIT) ×2
DRILL BIT SHORT 4.0 (BIT) ×2
DRSG MEPILEX BORDER 4X4 (GAUZE/BANDAGES/DRESSINGS) ×3 IMPLANT
DRSG MEPILEX BORDER 4X8 (GAUZE/BANDAGES/DRESSINGS) ×2 IMPLANT
DRSG MEPILEX FLEX 4X4 (GAUZE/BANDAGES/DRESSINGS) ×4
ELECT REM PT RETURN 9FT ADLT (ELECTROSURGICAL) ×2
ELECTRODE REM PT RTRN 9FT ADLT (ELECTROSURGICAL) ×1 IMPLANT
GLOVE BIO SURGEON STRL SZ7.5 (GLOVE) ×8 IMPLANT
GLOVE BIOGEL PI IND STRL 7.5 (GLOVE) ×1 IMPLANT
GLOVE BIOGEL PI INDICATOR 7.5 (GLOVE) ×1
GOWN STRL REUS W/ TWL LRG LVL3 (GOWN DISPOSABLE) ×1 IMPLANT
GOWN STRL REUS W/TWL LRG LVL3 (GOWN DISPOSABLE) ×4
GUIDE PIN 3.2X343 (PIN) ×2
GUIDE PIN 3.2X343MM (PIN) ×4
GUIDE ROD 3.0 (MISCELLANEOUS) ×2
KIT BASIN OR (CUSTOM PROCEDURE TRAY) ×2 IMPLANT
KIT TURNOVER KIT B (KITS) ×2 IMPLANT
MANIFOLD NEPTUNE II (INSTRUMENTS) ×2 IMPLANT
NAIL LOCK CANN 10X400 130D LT (Nail) ×1 IMPLANT
NS IRRIG 1000ML POUR BTL (IV SOLUTION) ×2 IMPLANT
PACK GENERAL/GYN (CUSTOM PROCEDURE TRAY) ×2 IMPLANT
PAD ARMBOARD 7.5X6 YLW CONV (MISCELLANEOUS) ×5 IMPLANT
PIN GUIDE 3.2X343MM (PIN) IMPLANT
ROD GUIDE 3.0 (MISCELLANEOUS) IMPLANT
SCREW LAG COMPR KIT 110/105 (Screw) ×1 IMPLANT
SCREW TRIGEN LOW PROF 5.0X47.5 (Screw) ×1 IMPLANT
SUT MNCRL AB 3-0 PS2 18 (SUTURE) ×2 IMPLANT
SUT MNCRL AB 3-0 PS2 27 (SUTURE) ×1 IMPLANT
SUT VIC AB 0 CT1 27 (SUTURE) ×2
SUT VIC AB 0 CT1 27XBRD ANBCTR (SUTURE) IMPLANT
SUT VIC AB 2-0 CT1 27 (SUTURE) ×2
SUT VIC AB 2-0 CT1 TAPERPNT 27 (SUTURE) ×2 IMPLANT
TOWEL GREEN STERILE (TOWEL DISPOSABLE) ×4 IMPLANT
WATER STERILE IRR 1000ML POUR (IV SOLUTION) ×2 IMPLANT

## 2022-03-16 NOTE — Anesthesia Procedure Notes (Signed)
Spinal  Patient location during procedure: OR Start time: 03/16/2022 11:08 AM End time: 03/16/2022 11:13 AM Reason for block: surgical anesthesia Staffing Performed: anesthesiologist  Anesthesiologist: Suzette Battiest, MD Performed by: Suzette Battiest, MD Authorized by: Suzette Battiest, MD   Preanesthetic Checklist Completed: patient identified, IV checked, site marked, risks and benefits discussed, surgical consent, monitors and equipment checked, pre-op evaluation and timeout performed Spinal Block Patient position: left lateral decubitus Prep: DuraPrep Patient monitoring: heart rate, cardiac monitor, continuous pulse ox and blood pressure Approach: midline Location: L3-4 Injection technique: single-shot Needle Needle type: Pencan  Needle gauge: 24 G Needle length: 9 cm Assessment Sensory level: T4 Events: CSF return

## 2022-03-16 NOTE — Hospital Course (Signed)
Jimmy Russell  is [name] 88 y.o. male with past medical history relevant for thrombocytopenia,CLL (chronic lymphocytic leukemia) with Richter's Transformation -in Remission,-Prior valve surgery with bovine replacement who presents to the ED after mechanical fall at home with left hip pain after the fall -Denies loss of consciousness chest pains palpitations or dizziness before after fall,, no concerns about seizures -- Apparently he was not using his walker -Additional history obtained from patient's son and daughter-in-law at bedside -No concerns about head injury -Cognitively according to patient's son and daughter-in-law patient appears to be at baseline -Hgb 12.6 -WBC 9.8.Marland Kitchen   -Platelets 140, INR 1.3 -Patient appears to have prior history of thrombocytopenia--- -No Bleeding concerns at this time monitor closely -EKG shows irregular rhythm with PVCs and left anterior fascicular block -Chest x-ray without acute findings -Hip Xrays--Comminuted left femur intertrochanteric fracture with varus impaction. -EDP discussed case with on-call orthopedic surgeon at [facility] who recommended transfer to [facility] for operative/orthopedic intervention

## 2022-03-16 NOTE — Consult Note (Signed)
Orthopaedic Trauma Service (OTS) Consult   Patient ID: Jimmy Russell MRN: [id number] DOB/AGE: June 15, 1920 88 y.o.  Reason for Consult:Left hip fracture Referring Physician: Dr. Garnette Gunner, MD [facility]  HPI: Jimmy Russell is an 88 y.o. male who is being seen in consultation at the request of Dr. Truett Mainland for evaluation of left hip fracture.  Patient had a ground-level fall and was brought to the [facility] emergency room.  Found to have a left intertrochanteric femur fracture.  I was consulted for evaluation and treatment.  Patient was seen and evaluated on 5 N.  Currently comfortable but confused.  I discussed over the phone with his son.  He lives in an apartment by himself his son takes primary care of him.  He ambulates with a walker at baseline.  He is independent.  He has no major medical problems.  He does have a heart valve for which he used to be on anticoagulation but he is currently on just an aspirin.  Denies any other injuries.  Past Medical History:  Diagnosis Date   CLL (chronic lymphoblastic leukemia)    CLL (chronic lymphocytic leukemia) with Richter's Transformation  09/02/2011   Port catheter in place 09/12/2012    Past Surgical History:  Procedure Laterality Date   AORTIC VALVE REPLACEMENT     APPENDECTOMY      Family History  Problem Relation Age of Onset   Cancer Brother        prostate cancer   Cancer Son        bladder cancer   Macular degeneration Sister     Social History:  reports that he has quit smoking. He has never used smokeless tobacco. He reports that he does not drink alcohol and does not use drugs.  Allergies: No Known Allergies  Medications:  No current facility-administered medications on file prior to encounter.   Current Outpatient Medications on File Prior to Encounter  Medication Sig Dispense Refill   aspirin EC 81 MG tablet Take 81 mg by mouth daily.     ENSURE (ENSURE) Take 237 mLs by mouth daily.      Hyprom-Naphaz-Polysorb-Zn Sulf (CLEAR EYES COMPLETE) SOLN Apply 1 drop to eye every morning.     Multiple Vitamins-Minerals (CENTRUM SILVER ADULT 50+ PO) Take 1 tablet by mouth daily.       ROS: Constitutional: No fever or chills Vision: No changes in vision ENT: No difficulty swallowing CV: No chest pain Pulm: No SOB or wheezing GI: No nausea or vomiting GU: No urgency or inability to hold urine Skin: No poor wound healing Neurologic: No numbness or tingling Psychiatric: No depression or anxiety Heme: No bruising Allergic: No reaction to medications or food   Exam: Blood pressure (!) 108/54, pulse 63, temperature 98.4 F (36.9 C), temperature source Oral, resp. rate 20, height '5\' 7"'$  (1.702 m), weight 60.3 kg, SpO2 98 %. General: No acute distress Orientation: Awake and alert, somewhat confused Mood and Affect: Cooperative and pleasant Gait: Unable to assess due to his fracture Coordination and balance: Within normal limits  Left lower extremity: Denies Buck's traction is in place.  Left leg is shortened and externally rotated.  Pain with any attempted range of motion.  No skin lesions.  Warm well-perfused foot.  Able to move the foot up and down dorsiflex and plantarflex the ankle and toes.  Sensation intact to light touch.  Brisk cap refill less than 2 seconds.  Right lower extremity skin without lesions. No tenderness to palpation.  Full painless ROM, full strength in each muscle groups without evidence of instability.   Medical Decision Making: Data: Imaging: X-rays of the pelvis and hip show a comminuted left intertrochanteric femur fracture with varus shortening.  Labs:  Results for orders placed or performed during the hospital encounter of 03/15/22 (from the past 24 hour(s))  Surgical pcr screen     Status: None   Collection Time: 03/15/22 10:10 PM   Specimen: Nasal Mucosa; Nasal Swab  Result Value Ref Range   MRSA, PCR NEGATIVE NEGATIVE   Staphylococcus aureus  NEGATIVE NEGATIVE  CBC     Status: Abnormal   Collection Time: 03/16/22  3:02 AM  Result Value Ref Range   WBC 9.3 4.0 - 10.5 K/uL   RBC 3.33 (L) 4.22 - 5.81 MIL/uL   Hemoglobin 10.8 (L) 13.0 - 17.0 g/dL   HCT 31.1 (L) 39.0 - 52.0 %   MCV 93.4 80.0 - 100.0 fL   MCH 32.4 26.0 - 34.0 pg   MCHC 34.7 30.0 - 36.0 g/dL   RDW 12.8 11.5 - 15.5 %   Platelets 124 (L) 150 - 400 K/uL   nRBC 0.0 0.0 - 0.2 %     Imaging or Labs ordered: None  Medical history and chart was reviewed and case discussed with medical provider.  Assessment/Plan: 88 year old male with a ground-level fall and a left intertrochanteric femur fracture.  Due to the unstable nature of his injury I recommend proceeding with cephalomedullary nailing of his left hip.  Risks and benefits were discussed with the patient and his son.  This included but not limited to bleeding, infection, malunion, nonunion, hardware failure, hardware irritation, nerve or blood vessel injury, DVT, even possibility anesthetic complications.  He agreed to proceed with surgery and consent was obtained.  Shona Needles, MD [facility] [phone] (office) [email or web address]

## 2022-03-16 NOTE — Assessment & Plan Note (Addendum)
SBP in 90's He's asymptomatic He was on phenylephrine post op, also got albumin Will continue to monitor, trend Hb/Hct post op Bolus as needed if worsening or symptomatic

## 2022-03-16 NOTE — Anesthesia Preprocedure Evaluation (Addendum)
Anesthesia Evaluation  Patient identified by MRN, date of birth, ID band Patient awake    Reviewed: Allergy & Precautions, NPO status , Patient's Chart, lab work & pertinent test results  Airway Mallampati: II  TM Distance: >3 FB Neck ROM: Full    Dental  (+) Dental Advisory Given   Pulmonary former smoker,    breath sounds clear to auscultation       Cardiovascular + dysrhythmias Atrial Fibrillation + Valvular Problems/Murmurs  Rhythm:Regular Rate:Normal     Neuro/Psych negative neurological ROS     GI/Hepatic negative GI ROS, Neg liver ROS,   Endo/Other  negative endocrine ROS  Renal/GU negative Renal ROS     Musculoskeletal   Abdominal   Peds  Hematology  (+) Blood dyscrasia (CLL), ,   Anesthesia Other Findings   Reproductive/Obstetrics                             Lab Results  Component Value Date   WBC 9.3 03/16/2022   HGB 10.8 (L) 03/16/2022   HCT 31.1 (L) 03/16/2022   MCV 93.4 03/16/2022   PLT 124 (L) 03/16/2022   Lab Results  Component Value Date   CREATININE 0.70 03/15/2022   BUN 19 03/15/2022   NA 136 03/15/2022   K 3.6 03/15/2022   CL 102 03/15/2022   CO2 28 03/15/2022   Lab Results  Component Value Date   INR 1.3 (H) 03/15/2022    Anesthesia Physical Anesthesia Plan  ASA: 4  Anesthesia Plan: Spinal   Post-op Pain Management: Regional block* and Tylenol PO (pre-op)*   Induction:   PONV Risk Score and Plan: 1 and Propofol infusion, Treatment may vary due to age or medical condition and Ondansetron  Airway Management Planned: Natural Airway and Simple Face Mask  Additional Equipment:   Intra-op Plan:   Post-operative Plan:   Informed Consent: I have reviewed the patients History and Physical, chart, labs and discussed the procedure including the risks, benefits and alternatives for the proposed anesthesia with the patient or authorized representative  who has indicated his/her understanding and acceptance.       Plan Discussed with:   Anesthesia Plan Comments:        Anesthesia Quick Evaluation

## 2022-03-16 NOTE — Assessment & Plan Note (Signed)
noted 

## 2022-03-16 NOTE — Interval H&P Note (Signed)
History and Physical Interval Note:  03/16/2022 10:14 AM  Jimmy Russell  has presented today for surgery, with the diagnosis of Left intertrochanteric femur fracture.  The various methods of treatment have been discussed with the patient and family. After consideration of risks, benefits and other options for treatment, the patient has consented to  Procedure(s): INTRAMEDULLARY (IM) NAIL INTERTROCHANTERIC (Left) as a surgical intervention.  The patient's history has been reviewed, patient examined, no change in status, stable for surgery.  I have reviewed the patient's chart and labs.  Questions were answered to the patient's satisfaction.     Lennette Bihari P Dsean Vantol

## 2022-03-16 NOTE — Anesthesia Procedure Notes (Signed)
Anesthesia Regional Block: Peng block   Pre-Anesthetic Checklist: , timeout performed,  Correct Patient, Correct Site, Correct Laterality,  Correct Procedure, Correct Position, site marked,  Risks and benefits discussed,  Surgical consent,  Pre-op evaluation,  At surgeon's request and post-op pain management  Laterality: Left  Prep: chloraprep       Needles:  Injection technique: Single-shot  Needle Type: Echogenic Needle     Needle Length: 9cm  Needle Gauge: 21     Additional Needles:   Procedures:,,,, ultrasound used (permanent image in chart),,    Narrative:  Start time: 03/16/2022 9:45 AM End time: 03/16/2022 9:50 AM Injection made incrementally with aspirations every 5 mL.  Performed by: Personally  Anesthesiologist: Suzette Battiest, MD

## 2022-03-16 NOTE — Assessment & Plan Note (Addendum)
Post op drop in Hb, trend May need transfusion if hypotensive/symptomaticx or Hb <7

## 2022-03-16 NOTE — Progress Notes (Signed)
PROGRESS NOTE    Jimmy Russell  TOI:[id number] DOB: June 08, 1920 DOA: 03/15/2022 PCP: Celene Squibb, MD  Chief Complaint  Patient presents with   Fall    Left hip deformity    Brief Narrative:  Jimmy Russell  is Jimmy Russell 88 y.o. male with past medical history relevant for thrombocytopenia,CLL (chronic lymphocytic leukemia) with Richter's Transformation -in Remission,-Prior valve surgery with bovine replacement who presents to the ED after mechanical fall at home with left hip pain after the fall -Denies loss of consciousness chest pains palpitations or dizziness before after fall,, no concerns about seizures -- Apparently he was not using his walker -Additional history obtained from patient's son and daughter-in-law at bedside -No concerns about head injury -Cognitively according to patient's son and daughter-in-law patient appears to be at baseline -Hgb 12.6 -WBC 9.8.Marland Kitchen   -Platelets 140, INR 1.3 -Patient appears to have prior history of thrombocytopenia--- -No Bleeding concerns at this time monitor closely -EKG shows irregular rhythm with PVCs and left anterior fascicular block -Chest x-ray without acute findings -Hip Xrays--Comminuted left femur intertrochanteric fracture with varus impaction. -EDP discussed case with on-call orthopedic surgeon at [facility] who recommended transfer to [facility] for operative/orthopedic intervention    Assessment & Plan:   Principal Problem:   Lt Hip fracture (Jimmy Russell) Active Problems:   CLL (chronic lymphocytic leukemia) with Richter's Transformation -in Remission   Hypotension   Anemia   Thrombocytopenia (HCC)   Assessment and Plan: * Lt Hip fracture (Jimmy Russell) Plain films with comminuted L femur intertrochanteric fractures with varus impaction S/p cephalomedullary nailing of L intertrochanteric femur fracture Post op pain management, dvt ppx per orthopedics Bowel regimen  Hypotension SBP in 90's He's asymptomatic He was on phenylephrine post  op, also got albumin Will continue to monitor, trend Hb/Hct post op Bolus as needed if worsening or symptomatic  CLL (chronic lymphocytic leukemia) with Richter's Transformation -in Remission noted  Anemia Post op drop in Hb, trend May need transfusion if hypotensive/symptomaticx or Hb <7  Thrombocytopenia ([place]) Downtrending, will follow closely     DVT prophylaxis: lovenox Code Status: full Family Communication: none Disposition:   Status is: Inpatient Remains inpatient appropriate because: pending therapy eval   Consultants:  ortho  Procedures:  Cephalomedullary nailing of left intertrochanteric femur fracture  Antimicrobials:  Anti-infectives (From admission, onward)    Start     Dose/Rate Route Frequency Ordered Stop   03/16/22 1645  ceFAZolin (ANCEF) IVPB 2g/100 mL premix        2 g 200 mL/hr over 30 Minutes Intravenous Every 8 hours 03/16/22 1556 03/17/22 1359       Subjective: Asking for scissors to cut something for his dentures  Objective: Vitals:   03/16/22 1604 03/16/22 1622 03/16/22 1655 03/16/22 1700  BP:  (!) 98/43 (!) 93/49   Pulse: 66 60 60 63  Resp: '20 18 14 19  '$ Temp:      TempSrc:      SpO2: 99% 100% 100% 100%  Weight:      Height:        Intake/Output Summary (Last 24 hours) at 03/16/2022 1833 Last data filed at 03/16/2022 1700 Gross per 24 hour  Intake 852.46 ml  Output 200 ml  Net 652.46 ml   Filed Weights   03/15/22 0702 03/16/22 0848  Weight: 60.3 kg 60.3 kg    Examination:  General exam: Appears calm and comfortable  Respiratory system: unlabored Cardiovascular system: RRR Gastrointestinal system: Abdomen is nondistended, soft and nontender Central  nervous system: Alert, seems at least mildly confused. No focal neurological deficits. Extremities: LLE with dressings post op  Data Reviewed: I have personally reviewed following labs and imaging studies  CBC: Recent Labs  Lab 03/15/22 0734 03/16/22 0302  03/16/22 1528  WBC 9.8 9.3 8.2  NEUTROABS 8.4*  --   --   HGB 12.6* 10.8* 8.7*  HCT 36.9* 31.1* 27.0*  MCV 94.4 93.4 101.1*  PLT 140* 124* 83*    Basic Metabolic Panel: Recent Labs  Lab 03/15/22 0734  NA 136  K 3.6  CL 102  CO2 28  GLUCOSE 142*  BUN 19  CREATININE 0.70  CALCIUM 8.6*    GFR: Estimated Creatinine Clearance: 42.9 mL/min (by C-G formula based on SCr of 0.7 mg/dL).  Liver Function Tests: No results for input(s): "AST", "ALT", "ALKPHOS", "BILITOT", "PROT", "ALBUMIN" in the last 168 hours.  CBG: No results for input(s): "GLUCAP" in the last 168 hours.   Recent Results (from the past 240 hour(s))  Surgical pcr screen     Status: None   Collection Time: 03/15/22 10:10 PM   Specimen: Nasal Mucosa; Nasal Swab  Result Value Ref Range Status   MRSA, PCR NEGATIVE NEGATIVE Final   Staphylococcus aureus NEGATIVE NEGATIVE Final    Comment: (NOTE) The Xpert SA Assay (FDA approved for NASAL specimens in patients 53 years of age and older), is one component of Jimmy Russell comprehensive surveillance program. It is not intended to diagnose infection nor to guide or monitor treatment. Performed at [facility], [place] [address], [place], [place] [address]          Radiology Studies: DG Pelvis 1-2 Views  Result Date: 03/16/2022 CLINICAL DATA:  Postop EXAM: PELVIS - 1-2 VIEW; LEFT FEMUR 2 VIEWS COMPARISON:  No radiographs 03/15/2022 FINDINGS: Interval postoperative change of intramedullary rod and screw fixation across Jimmy Russell comminuted left intertrochanteric femur fracture. Improved alignment. No new fracture. Subcutaneous emphysema. IMPRESSION: Expected postoperative changes of IM rod and screw fixation across Jimmy Russell comminuted left intertrochanteric femur fracture. Electronically Signed   By: Jimmy Russell M.D.   On: 03/16/2022 18:12   DG FEMUR MIN 2 VIEWS LEFT  Result Date: 03/16/2022 CLINICAL DATA:  Postop EXAM: PELVIS - 1-2 VIEW; LEFT FEMUR 2 VIEWS COMPARISON:  No  radiographs 03/15/2022 FINDINGS: Interval postoperative change of intramedullary rod and screw fixation across Manpreet Strey comminuted left intertrochanteric femur fracture. Improved alignment. No new fracture. Subcutaneous emphysema. IMPRESSION: Expected postoperative changes of IM rod and screw fixation across Ras Kollman comminuted left intertrochanteric femur fracture. Electronically Signed   By: Jimmy Russell M.D.   On: 03/16/2022 18:12   DG FEMUR MIN 2 VIEWS LEFT  Result Date: 03/16/2022 CLINICAL DATA:  Left femoral intramedullary nail placement EXAM: LEFT FEMUR 2 VIEWS COMPARISON:  03/15/2022 FINDINGS: Eight fluoroscopic images are obtained during the performance of the procedure and are provided for interpretation only. Intramedullary rod with proximal dynamic and distal interlocking screws traverse the intertrochanteric left hip fracture seen previously. Alignment is near anatomic. Please refer to the operative report. Fluoroscopy time: 2 minutes 24 seconds, 16.78 mGy IMPRESSION: 1. ORIF left hip fracture as above. Electronically Signed   By: Randa Ngo M.D.   On: 03/16/2022 12:49   DG C-Arm 1-60 Min-No Report  Result Date: 03/16/2022 Fluoroscopy was utilized by the requesting physician.  No radiographic interpretation.   DG C-Arm 1-60 Min-No Report  Result Date: 03/16/2022 Fluoroscopy was utilized by the requesting physician.  No radiographic interpretation.   DG CHEST  PORT 1 VIEW  Result Date: 03/15/2022 CLINICAL DATA:  Preoperative study EXAM: PORTABLE CHEST 1 VIEW COMPARISON:  November 08, 2014 FINDINGS: The right hemidiaphragm remains elevated with apparent atelectasis in the right base, stable. Stable right calcified nodule. No pneumothorax. No focal infiltrate. No change in the cardiomediastinal silhouette. IMPRESSION: No acute interval change or abnormality. Electronically Signed   By: Dorise Bullion III M.D.   On: 03/15/2022 09:44   DG Hip Unilat With Pelvis 2-3 Views Left  Result Date:  03/15/2022 CLINICAL DATA:  88 year old male status post fall. Left leg deformity. EXAM: DG HIP (WITH OR WITHOUT PELVIS) 2-3V LEFT; LEFT FEMUR 1 VIEW COMPARISON:  None Available. FINDINGS: Comminuted left femur intertrochanteric fracture with mild varus impaction. Butterfly fragment of the lesser trochanter. Left femoral head remains normally located. No superimposed acute fracture of the pelvis identified. Right femoral head normally located. Left femoral shaft and distal left femur appear intact. Calcified left femoral artery atherosclerosis. Negative visible bowel gas pattern. IMPRESSION: Comminuted left femur intertrochanteric fracture with varus impaction. Electronically Signed   By: Genevie Ann M.D.   On: 03/15/2022 08:37   DG Femur 1V Left  Result Date: 03/15/2022 CLINICAL DATA:  88 year old male status post fall. Left leg deformity. EXAM: DG HIP (WITH OR WITHOUT PELVIS) 2-3V LEFT; LEFT FEMUR 1 VIEW COMPARISON:  None Available. FINDINGS: Comminuted left femur intertrochanteric fracture with mild varus impaction. Butterfly fragment of the lesser trochanter. Left femoral head remains normally located. No superimposed acute fracture of the pelvis identified. Right femoral head normally located. Left femoral shaft and distal left femur appear intact. Calcified left femoral artery atherosclerosis. Negative visible bowel gas pattern. IMPRESSION: Comminuted left femur intertrochanteric fracture with varus impaction. Electronically Signed   By: Genevie Ann M.D.   On: 03/15/2022 08:37        Scheduled Meds:  aspirin EC  81 mg Oral Daily   chlorhexidine       [START ON 03/17/2022] enoxaparin (LOVENOX) injection  40 mg Subcutaneous Q24H   feeding supplement  237 mL Oral BID   methocarbamol  500 mg Oral TID   multivitamin with minerals   Oral Daily   polyethylene glycol  17 g Oral Daily   polyvinyl alcohol  1 drop Both Eyes q morning   senna-docusate  2 tablet Oral BID   sodium chloride flush  3 mL Intravenous  Q12H   sodium chloride flush  3 mL Intravenous Q12H   Continuous Infusions:  sodium chloride     albumin human     albumin human      ceFAZolin (ANCEF) IV 2 g (03/16/22 1802)     LOS: 1 day    Time spent: over 30 min    Fayrene Helper, MD [facility]   To contact the attending provider between 7A-7P or the covering provider during after hours 7P-7A, please log into the web site [email or web address] and access using universal [place] password for that web site. If you do not have the password, please call the hospital operator.  03/16/2022, 6:33 PM

## 2022-03-16 NOTE — H&P (View-Only) (Signed)
Orthopaedic Trauma Service (OTS) Consult   Patient ID: Jimmy Russell MRN: [id number] DOB/AGE: [birth year] 88 y.o.  Reason for Consult:Left hip fracture Referring Physician: Dr. Garnette Gunner, MD [facility]  HPI: Jimmy Russell is an 88 y.o. male who is being seen in consultation at the request of Dr. Truett Mainland for evaluation of left hip fracture.  Patient had a ground-level fall and was brought to the [facility] emergency room.  Found to have a left intertrochanteric femur fracture.  I was consulted for evaluation and treatment.  Patient was seen and evaluated on 5 N.  Currently comfortable but confused.  I discussed over the phone with his son.  He lives in an apartment by himself his son takes primary care of him.  He ambulates with a walker at baseline.  He is independent.  He has no major medical problems.  He does have a heart valve for which he used to be on anticoagulation but he is currently on just an aspirin.  Denies any other injuries.  Past Medical History:  Diagnosis Date   CLL (chronic lymphoblastic leukemia)    CLL (chronic lymphocytic leukemia) with Richter's Transformation  09/02/2011   Port catheter in place 09/12/2012    Past Surgical History:  Procedure Laterality Date   AORTIC VALVE REPLACEMENT     APPENDECTOMY      Family History  Problem Relation Age of Onset   Cancer Brother        prostate cancer   Cancer Son        bladder cancer   Macular degeneration Sister     Social History:  reports that he has quit smoking. He has never used smokeless tobacco. He reports that he does not drink alcohol and does not use drugs.  Allergies: No Known Allergies  Medications:  No current facility-administered medications on file prior to encounter.   Current Outpatient Medications on File Prior to Encounter  Medication Sig Dispense Refill   aspirin EC 81 MG tablet Take 81 mg by mouth daily.     ENSURE (ENSURE) Take 237 mLs by mouth daily.      Hyprom-Naphaz-Polysorb-Zn Sulf (CLEAR EYES COMPLETE) SOLN Apply 1 drop to eye every morning.     Multiple Vitamins-Minerals (CENTRUM SILVER ADULT 50+ PO) Take 1 tablet by mouth daily.       ROS: Constitutional: No fever or chills Vision: No changes in vision ENT: No difficulty swallowing CV: No chest pain Pulm: No SOB or wheezing GI: No nausea or vomiting GU: No urgency or inability to hold urine Skin: No poor wound healing Neurologic: No numbness or tingling Psychiatric: No depression or anxiety Heme: No bruising Allergic: No reaction to medications or food   Exam: Blood pressure (!) 108/54, pulse 63, temperature 98.4 F (36.9 C), temperature source Oral, resp. rate 20, height '5\' 7"'$  (1.702 m), weight 60.3 kg, SpO2 98 %. General: No acute distress Orientation: Awake and alert, somewhat confused Mood and Affect: Cooperative and pleasant Gait: Unable to assess due to his fracture Coordination and balance: Within normal limits  Left lower extremity: Denies Buck's traction is in place.  Left leg is shortened and externally rotated.  Pain with any attempted range of motion.  No skin lesions.  Warm well-perfused foot.  Able to move the foot up and down dorsiflex and plantarflex the ankle and toes.  Sensation intact to light touch.  Brisk cap refill less than 2 seconds.  Right lower extremity skin without lesions. No tenderness to palpation.  Full painless ROM, full strength in each muscle groups without evidence of instability.   Medical Decision Making: Data: Imaging: X-rays of the pelvis and hip show a comminuted left intertrochanteric femur fracture with varus shortening.  Labs:  Results for orders placed or performed during the hospital encounter of 03/15/22 (from the past 24 hour(s))  Surgical pcr screen     Status: None   Collection Time: 03/15/22 10:10 PM   Specimen: Nasal Mucosa; Nasal Swab  Result Value Ref Range   MRSA, PCR NEGATIVE NEGATIVE   Staphylococcus aureus  NEGATIVE NEGATIVE  CBC     Status: Abnormal   Collection Time: 03/16/22  3:02 AM  Result Value Ref Range   WBC 9.3 4.0 - 10.5 K/uL   RBC 3.33 (L) 4.22 - 5.81 MIL/uL   Hemoglobin 10.8 (L) 13.0 - 17.0 g/dL   HCT 31.1 (L) 39.0 - 52.0 %   MCV 93.4 80.0 - 100.0 fL   MCH 32.4 26.0 - 34.0 pg   MCHC 34.7 30.0 - 36.0 g/dL   RDW 12.8 11.5 - 15.5 %   Platelets 124 (L) 150 - 400 K/uL   nRBC 0.0 0.0 - 0.2 %     Imaging or Labs ordered: None  Medical history and chart was reviewed and case discussed with medical provider.  Assessment/Plan: 88 year old male with a ground-level fall and a left intertrochanteric femur fracture.  Due to the unstable nature of his injury I recommend proceeding with cephalomedullary nailing of his left hip.  Risks and benefits were discussed with the patient and his son.  This included but not limited to bleeding, infection, malunion, nonunion, hardware failure, hardware irritation, nerve or blood vessel injury, DVT, even possibility anesthetic complications.  He agreed to proceed with surgery and consent was obtained.  Shona Needles, MD [facility] [phone] (office) [email or web address]

## 2022-03-16 NOTE — Anesthesia Postprocedure Evaluation (Signed)
Anesthesia Post Note  Patient: Jimmy Russell  Procedure(s) Performed: INTRAMEDULLARY (IM) NAIL INTERTROCHANTERIC (Left: Hip)     Patient location during evaluation: PACU Anesthesia Type: Spinal Level of consciousness: awake and alert Pain management: pain level controlled Vital Signs Assessment: post-procedure vital signs reviewed and stable Respiratory status: spontaneous breathing, nonlabored ventilation, respiratory function stable and patient connected to nasal cannula oxygen Cardiovascular status: blood pressure returned to baseline and stable Postop Assessment: no apparent nausea or vomiting Anesthetic complications: no   No notable events documented.  Last Vitals:  Vitals:   03/16/22 1530 03/16/22 1555  BP: 99/61 (!) 103/46  Pulse: 61 62  Resp: 16 16  Temp: 36.7 C 36.8 C  SpO2: 100% 98%    Last Pain:  Vitals:   03/16/22 1555  TempSrc: Oral  PainSc: 0-No pain                 Tiajuana Amass

## 2022-03-16 NOTE — Transfer of Care (Signed)
Immediate Anesthesia Transfer of Care Note  Patient: Nasir Bright  Procedure(s) Performed: INTRAMEDULLARY (IM) NAIL INTERTROCHANTERIC (Left: Hip)  Patient Location: PACU  Anesthesia Type:Spinal  Level of Consciousness: awake and oriented  Airway & Oxygen Therapy: Patient Spontanous Breathing  Post-op Assessment: Report given to RN  Post vital signs: Reviewed and stable  Last Vitals:  Vitals Value Taken Time  BP 101/62 03/16/22 1245  Temp    Pulse 57 03/16/22 1246  Resp 11 03/16/22 1246  SpO2 96 % 03/16/22 1246  Vitals shown include unvalidated device data.  Last Pain:  Vitals:   03/16/22 0955  TempSrc:   PainSc: 0-No pain      Patients Stated Pain Goal: 0 (98/72/15 8727)  Complications: No notable events documented.

## 2022-03-16 NOTE — Op Note (Signed)
Orthopaedic Surgery Operative Note (CSN: [id number] ) Date of Surgery: 03/16/2022  Admit Date: 03/15/2022   Diagnoses: Pre-Op Diagnoses: Left intertrochanteric femur fracture  Post-Op Diagnosis: Same  Procedures: CPT 27245-Cephalomedullary nailing of left intertrochanteric femur fracture  Surgeons : Primary: Shona Needles, MD  Assistant: None  Location: OR 3   Anesthesia:Spinal   Antibiotics: Ancef 2g preop   Tourniquet time: None used    Estimated Blood Loss: 50 mL   Complications:None  Specimens:None  Implants: Implant Name Type Inv. Item Serial No. Manufacturer Lot No. LRB No. Used Action  NAIL LOCK CANN 10X400 130D LT - [id number] Nail NAIL LOCK CANN 10X400 130D LT  [employer] [id number] Left 1 Implanted  SCREW LAG COMPR KIT 110/105 - [id number] Screw SCREW LAG COMPR KIT 110/105  [employer] [id number] Left 1 Implanted  SCREW TRIGEN LOW PROF 5.0X47.5 - [id number] Screw SCREW TRIGEN LOW PROF 5.0X47.5  [employer] [id number] Left 1 Implanted     Indications for Surgery: 88 year old male who had a ground-level fall sustaining a left intertrochanteric femur fracture.  Due to the unstable nature of his injury I recommend proceeding with cephalomedullary nailing of his left hip.  Risks and benefits were discussed with the patient and his son.  Risks included but not limited to bleeding, infection, malunion, nonunion, hardware failure, hardware irritation, nerve or blood vessel injury, DVT, even the possibility anesthetic complications.  They agreed to proceed with surgery and consent was obtained.  Operative Findings: Cephalomedullary nailing of left intertrochanteric femur fracture using [employer] 10 x 400 mm InterTAN with a 110 mm lag screw  Procedure: The patient was identified in the preoperative holding area. Consent was confirmed with the patient and their family and all questions were answered. The operative  extremity was marked after confirmation with the patient. he was then brought back to the operating room by our anesthesia colleagues.  He was placed under spinal anesthetic and carefully transferred over to the [facility] table.  All bony prominences were well-padded.  Fluoroscopic imaging showed the unstable nature of his injury.  Traction was applied to the lower extremity and alignment was maintained with the traction bed.  The left lower extremity was then prepped and draped in usual sterile fashion.  A timeout was performed to verify the patient, the procedure, and the extremity.  Preoperative antibiotics were dosed.  A small incision proximal to the greater trochanter was made and carried down through skin and subcutaneous tissue.  I directed a threaded guidewire at the tip of the greater trochanter and advanced into the proximal metaphysis.  His greater trochanter was severely comminuted so I was able to guide it into the shaft of the femur.  I then used an entry reamer to enter the medullary canal.  I then passed a ball-tipped guidewire in the center canal and seated into the distal metaphysis.  I measured the length and chose to use a 400 mm nail.  I then reamed with a 10 mm reamer.  I then passed a 10 x 400 mm nail down the center of the canal.  I then directed a threaded guidewire up into the head/neck segment.  I used a bone hook through a percutaneous incision to reduce the femoral neck.  I was able to directed into the femoral head and confirmed adequate tip apex distance.  I then measured the length and chose to use 110 mm lag screw.  I drilled the path for the compression  screw placed an antirotation bar.  I drilled the path for the lag screw and then placed the lag screw.  I then placed the compression screw and compressed approximately 5 to 10 mm.  I then statically locked the proximal portion of the nail.  Using perfect circle technique distally I then placed a lateral to medial distal interlocking  screw.  The targeting arm was removed.  Final fluoroscopic imaging was obtained.  The incision was copiously irrigated.  A layered closure of 2-0 Vicryl and 3-0 Monocryl with Dermabond was used to close the skin.  Sterile dressings were applied.  Patient was then awoken from anesthesia and taken to the PACU in stable condition.  Post Op Plan/Instructions: Patient be weightbearing as tolerated to left lower extremity.  He will receive postoperative Ancef.  He will receive Lovenox for DVT prophylaxis.  He will mobilize with physical and Occupational Therapy.  I was present and performed the entire surgery.  Katha Hamming, MD [facility]

## 2022-03-16 NOTE — Progress Notes (Signed)
An USGPIV (ultrasound guided PIV) has been placed for short-term vasopressor infusion. A correctly placed ivWatch must be used when administering Vasopressors. Should this treatment be needed beyond 72 hours, central line access should be obtained.  It will be the responsibility of the bedside nurse to follow best practice to prevent extravasations.   ?

## 2022-03-16 NOTE — Assessment & Plan Note (Signed)
Plain films with comminuted L femur intertrochanteric fractures with varus impaction S/p cephalomedullary nailing of L intertrochanteric femur fracture Post op pain management, dvt ppx per orthopedics Bowel regimen

## 2022-03-16 NOTE — Assessment & Plan Note (Signed)
Downtrending, will follow closely

## 2022-03-17 DIAGNOSIS — S72002A Fracture of unspecified part of neck of left femur, initial encounter for closed fracture: Secondary | ICD-10-CM | POA: Diagnosis not present

## 2022-03-17 LAB — CBC
HCT: 21.2 % — ABNORMAL LOW (ref 39.0–52.0)
Hemoglobin: 7.3 g/dL — ABNORMAL LOW (ref 13.0–17.0)
MCH: 32.9 pg (ref 26.0–34.0)
MCHC: 34.4 g/dL (ref 30.0–36.0)
MCV: 95.5 fL (ref 80.0–100.0)
Platelets: 91 10*3/uL — ABNORMAL LOW (ref 150–400)
RBC: 2.22 MIL/uL — ABNORMAL LOW (ref 4.22–5.81)
RDW: 12.7 % (ref 11.5–15.5)
WBC: 7.9 10*3/uL (ref 4.0–10.5)
nRBC: 0 % (ref 0.0–0.2)

## 2022-03-17 LAB — BASIC METABOLIC PANEL
Anion gap: 9 (ref 5–15)
BUN: 21 mg/dL (ref 8–23)
CO2: 24 mmol/L (ref 22–32)
Calcium: 8.4 mg/dL — ABNORMAL LOW (ref 8.9–10.3)
Chloride: 102 mmol/L (ref 98–111)
Creatinine, Ser: 0.81 mg/dL (ref 0.61–1.24)
GFR, Estimated: >60 mL/min (ref >60)
Glucose, Bld: 121 mg/dL — ABNORMAL HIGH (ref 70–99)
Potassium: 3.9 mmol/L (ref 3.5–5.1)
Sodium: 135 mmol/L (ref 135–145)

## 2022-03-17 LAB — HEMOGLOBIN AND HEMATOCRIT, BLOOD
HCT: 20.4 % — ABNORMAL LOW (ref 39.0–52.0)
Hemoglobin: 7.2 g/dL — ABNORMAL LOW (ref 13.0–17.0)

## 2022-03-17 LAB — VITAMIN D 25 HYDROXY (VIT D DEFICIENCY, FRACTURES): Vit D, 25-Hydroxy: 30.81 ng/mL (ref 30–100)

## 2022-03-17 MED ORDER — FERROUS SULFATE 325 (65 FE) MG PO TABS
325.0000 mg | ORAL_TABLET | ORAL | Status: DC
  Administered 2022-03-17 – 2022-03-19 (×2): 325 mg via ORAL
  Filled 2022-03-17 (×2): qty 1

## 2022-03-17 NOTE — NC FL2 (Signed)
[place] MEDICAID FL2 LEVEL OF CARE SCREENING TOOL     IDENTIFICATION  Patient Name: Jimmy Russell Birthdate: Mar 01, 1920 Sex: male Admission Date (Current Location): 03/15/2022  [facility] and [place] Number:  Herbalist and Address:  [facility] [place], [place] [address] [place], [place] [address]      Provider Number: [id number]  Attending Physician Name and Address:  Elodia Florence., *  Relative Name and Phone Number:  Krishawn, [place] [phone]  [phone]    Current Level of Care: Hospital Recommended Level of Care: [place] Prior Approval Number:    Date Approved/Denied:   PASRR Number: [id number]  Discharge Plan: SNF    Current Diagnoses: Patient Active Problem List   Diagnosis Date Noted   Anemia 03/16/2022   Thrombocytopenia ([place]) 03/16/2022   Hypotension 03/16/2022   Lt Hip fracture ([place]) 03/15/2022   Port catheter in place 09/12/2012   CLL (chronic lymphocytic leukemia) with Richter's Transformation -in Remission 09/02/2011    Orientation RESPIRATION BLADDER Height & Weight     Self, Time, Situation, Place  Normal Continent Weight: 133 lb (60.3 kg) Height:  '5\' 7"'$  (170.2 cm)  BEHAVIORAL SYMPTOMS/MOOD NEUROLOGICAL BOWEL NUTRITION STATUS      Continent Diet (see discharge summary)  AMBULATORY STATUS COMMUNICATION OF NEEDS Skin   Total Care Verbally Surgical wounds                       Personal Care Assistance Level of Assistance  Bathing, Feeding, Dressing, Total care Bathing Assistance: Maximum assistance Feeding assistance: Limited assistance Dressing Assistance: Maximum assistance Total Care Assistance: Maximum assistance   Functional Limitations Info  Sight, Hearing, Speech Sight Info: Adequate Hearing Info: Adequate Speech Info: Adequate    SPECIAL CARE FACTORS FREQUENCY  PT (By licensed PT), OT (By licensed OT)     PT Frequency: 5x week OT Frequency: 5x week             Contractures Contractures Info: Not present    Additional Factors Info  Code Status, Allergies Code Status Info: full Allergies Info: NKA           Current Medications (03/17/2022):  This is the current hospital active medication list Current Facility-Administered Medications  Medication Dose Route Frequency Provider Last Rate Last Admin   0.9 %  sodium chloride infusion   Intravenous PRN Haddix, Thomasene Lot, MD       acetaminophen (TYLENOL) tablet 650 mg  650 mg Oral Q6H PRN Haddix, Thomasene Lot, MD       Or   acetaminophen (TYLENOL) suppository 650 mg  650 mg Rectal Q6H PRN Haddix, Thomasene Lot, MD       aspirin EC tablet 81 mg  81 mg Oral Daily Haddix, Thomasene Lot, MD   81 mg at 03/17/22 1011   bisacodyl (DULCOLAX) suppository 10 mg  10 mg Rectal Daily PRN Haddix, Thomasene Lot, MD       enoxaparin (LOVENOX) injection 40 mg  40 mg Subcutaneous Q24H Haddix, Thomasene Lot, MD   40 mg at 03/17/22 0758   feeding supplement (ENSURE ENLIVE / ENSURE PLUS) liquid 237 mL  237 mL Oral BID Haddix, Thomasene Lot, MD   237 mL at 03/17/22 1056   fentaNYL (SUBLIMAZE) injection 25 mcg  25 mcg Intravenous Q2H PRN Haddix, Thomasene Lot, MD   25 mcg at 03/17/22 0758   methocarbamol (ROBAXIN) tablet 500 mg  500 mg Oral TID Haddix, Thomasene Lot, MD  500 mg at 03/17/22 1011   multivitamin with minerals tablet   Oral Daily Haddix, Thomasene Lot, MD   1 tablet at 03/17/22 1011   ondansetron (ZOFRAN) tablet 4 mg  4 mg Oral Q6H PRN Haddix, Thomasene Lot, MD       Or   ondansetron (ZOFRAN) injection 4 mg  4 mg Intravenous Q6H PRN Haddix, Thomasene Lot, MD       oxyCODONE (Oxy IR/ROXICODONE) immediate release tablet 5 mg  5 mg Oral Q4H PRN Haddix, Thomasene Lot, MD   5 mg at 03/17/22 1158   polyethylene glycol (MIRALAX / GLYCOLAX) packet 17 g  17 g Oral Daily PRN Haddix, Thomasene Lot, MD       polyethylene glycol (MIRALAX / GLYCOLAX) packet 17 g  17 g Oral Daily Elodia Florence., MD   17 g at 03/17/22 1012   polyvinyl alcohol (LIQUIFILM TEARS) 1.4 % ophthalmic solution 1  drop  1 drop Both Eyes q morning Haddix, Thomasene Lot, MD   1 drop at 03/15/22 1551   senna-docusate (Senokot-S) tablet 2 tablet  2 tablet Oral BID Haddix, Thomasene Lot, MD   2 tablet at 03/17/22 1034   sodium chloride flush (NS) 0.9 % injection 3 mL  3 mL Intravenous Q12H Haddix, Thomasene Lot, MD   3 mL at 03/16/22 2153   sodium chloride flush (NS) 0.9 % injection 3 mL  3 mL Intravenous Q12H Haddix, Thomasene Lot, MD   3 mL at 03/17/22 1035   sodium chloride flush (NS) 0.9 % injection 3 mL  3 mL Intravenous PRN Haddix, Thomasene Lot, MD       traZODone (DESYREL) tablet 50 mg  50 mg Oral QHS PRN Haddix, Thomasene Lot, MD         Discharge Medications: Please see discharge summary for a list of discharge medications.  Relevant Imaging Results:  Relevant Lab Results:   Additional Information SSN: [id number], pt is vaccinated for covid with one booster.  Joanne Chars, LCSW

## 2022-03-17 NOTE — Plan of Care (Signed)

## 2022-03-17 NOTE — Evaluation (Signed)
Occupational Therapy Evaluation Patient Details Name: Jimmy Russell MRN: [id number] DOB: 03-31-1920 Today's Date: 03/17/2022   History of Present Illness 88 y.o. M who presents 03/15/2022 with a fall. Found to have L femur intertrochanteric fractures now s/p cephalomedullary nailing. Significant PMH: CLL in remission, thrombocytopenia.   Clinical Impression   Patient is s/p IMN L hip surgery resulting in functional limitations due to the deficits listed below (see OT problem list). Pt currently requires total +2 Max (A) to pivot to chair with extensive cues. REcommend hoyer oob to chair. Pt fixated on urinal use at all times so could possibly benefit from trial with brief to help with internal distractions. Pt lives alone and reports family checks on him every 3 weeks. Pt reports walking daily for 30 minutes.  Patient will benefit from skilled OT acutely to increase independence and safety with ADLS to allow discharge SNF.(Pending progress) Pt stated waiting to "go back home" from hospital. No family present to confirm (A) upon d/c       Recommendations for follow up therapy are one component of a multi-disciplinary discharge planning process, led by the attending physician.  Recommendations may be updated based on patient status, additional functional criteria and insurance authorization.   Follow Up Recommendations  Skilled nursing-short term rehab (<3 hours/day)    Assistance Recommended at Discharge Intermittent Supervision/Assistance  Patient can return home with the following Two people to help with walking and/or transfers;Two people to help with bathing/dressing/bathroom;Assistance with cooking/housework;Assistance with feeding;Assist for transportation    Functional Status Assessment  Patient has had a recent decline in their functional status and demonstrates the ability to make significant improvements in function in a reasonable and predictable amount of time.  Equipment  Recommendations  BSC/3in1;Wheelchair (measurements OT);Wheelchair cushion (measurements OT);Hospital bed    Recommendations for Other Services       Precautions / Restrictions Precautions Precautions: Fall Restrictions Weight Bearing Restrictions: Yes LLE Weight Bearing: Weight bearing as tolerated      Mobility Bed Mobility Overal bed mobility: Needs Assistance Bed Mobility: Supine to Sit     Supine to sit: Max assist, +2 for physical assistance     General bed mobility comments: Helicopter method to progress to edge of bed as pt initially resistive and guarding heavily away from L hip    Transfers Overall transfer level: Needs assistance Equipment used: None Transfers: Bed to chair/wheelchair/BSC     Squat pivot transfers: Max assist, +2 physical assistance       General transfer comment: MaxA + 2 for squat pivot transfer from bed to chair towards right. Pt able to clear hips from surface      Balance Overall balance assessment: Needs assistance Sitting-balance support: Feet supported Sitting balance-Leahy Scale: Poor Sitting balance - Comments: pt guarding L hip with right lateral lean, initially requiring maxA progressing to min guard assist                                   ADL either performed or assessed with clinical judgement   ADL Overall ADL's : Needs assistance/impaired Eating/Feeding: Set up   Grooming: Minimal assistance;Bed level Grooming Details (indicate cue type and reason): requires (A) to fully wipe food off face from previous meals             Lower Body Dressing: Total assistance Lower Body Dressing Details (indicate cue type and reason): don socks. pt states "be careful witht  hat one" sock was don and pt unaware   Toilet Transfer Details (indicate cue type and reason): simulated oob to chair           General ADL Comments: pt progressed to chair. pt fixated on using urinal and sustianing it in place      Vision Baseline Vision/History: 1 Wears glasses Additional Comments: noted to have glasses on computer in room     Perception     Praxis      Pertinent Vitals/Pain Pain Assessment Pain Assessment: Faces Faces Pain Scale: Hurts worst Pain Location: LLE during mobility Pain Descriptors / Indicators: Grimacing, Guarding, Sharp Pain Intervention(s): Limited activity within patient's tolerance, Monitored during session, Premedicated before session, Repositioned, Other (comment) (requesting RN for anything that can be for pain.)     Hand Dominance Right   Extremity/Trunk Assessment Upper Extremity Assessment Upper Extremity Assessment: Generalized weakness   Lower Extremity Assessment Lower Extremity Assessment: Generalized weakness LLE Deficits / Details: L intertrochanteric femur fx s/p nailing. 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 Perservating on needing "pee bucket," (urinal) to hold onto throughout. stating he wants to "go back home and get out of this hospital system," and reports he has enough food to last him a while     General Comments  dressing dry and intact. Ice applied to hip area and two warm blankets placed on patient. Rn called to room to address L IV site and provide any oral medications allowed for pain    Exercises     Shoulder Instructions      Home Living Family/patient expects to be discharged to:: Private residence Living Arrangements: Alone Available Help at Discharge: Family                          Home Equipment: Conservation officer, nature (2 wheels)   Additional Comments: Poor historian.      Prior Functioning/Environment Prior Level of Function : Needs assist             Mobility Comments: utilizing RW per chart review. Pt states he walks 30 minutes per day but unsure of accuracy ADLs Comments: pt reports indep ADL's. states son comes "every 3 weeks," to drop off supplies        OT Problem List: Decreased strength;Decreased activity tolerance;Impaired balance (sitting and/or standing);Decreased cognition;Decreased safety awareness;Decreased knowledge of use of DME or AE;Decreased knowledge of precautions;Pain      OT Treatment/Interventions: Self-care/ADL training;Therapeutic exercise;Energy conservation;DME and/or AE instruction;Manual therapy;Modalities;Therapeutic activities;Cognitive remediation/compensation;Patient/family education;Balance training    OT Goals(Current goals can be found in the care plan section) Acute Rehab OT Goals Patient Stated Goal: to be able to get out of hospital OT Goal Formulation: Patient unable to participate in goal setting Time For Goal Achievement: 03/31/22 Potential to Achieve Goals: Good  OT Frequency: Min 2X/week    Co-evaluation PT/OT/SLP Co-Evaluation/Treatment: Yes Reason for Co-Treatment: Complexity of the patient's impairments (multi-system involvement);Necessary to address cognition/behavior during functional activity;For patient/therapist safety;To address functional/ADL  transfers PT goals addressed during session: Mobility/safety with mobility OT goals addressed during session: ADL's and self-care;Proper use of Adaptive equipment and DME;Strengthening/ROM      AM-PAC OT "6 Clicks" Daily Activity     Outcome Measure Help from another person eating meals?: A Little Help from another person taking care of personal grooming?: A Little Help from another person toileting, which includes using toliet, bedpan, or urinal?: A Lot Help  from another person bathing (including washing, rinsing, drying)?: A Lot Help from another person to put on and taking off regular upper body clothing?: A Lot Help from another person to put on and taking off regular lower body clothing?: Total 6 Click Score: 13   End of Session Equipment Utilized During Treatment: Gait belt Nurse Communication: Mobility status;Need for lift equipment;Precautions;Weight bearing status  Activity Tolerance: Patient tolerated treatment well Patient left: in chair;with call bell/phone within reach;with chair alarm set  OT Visit Diagnosis: Unsteadiness on feet (R26.81);Muscle weakness (generalized) (M62.81);Pain Pain - Right/Left: Left Pain - part of body: Hip                Time: 1129-1200 OT Time Calculation (min): 31 min Charges:  OT General Charges $OT Visit: 1 Visit OT Evaluation $OT Eval Moderate Complexity: 1 Mod   Brynn, OTR/L  Acute Rehabilitation Services Office: [phone] .   Jeri Modena 03/17/2022, 2:21 PM

## 2022-03-17 NOTE — Plan of Care (Signed)
  Problem: Education: Goal: Knowledge of General Education information will improve Description: Including pain rating scale, medication(s)/side effects and non-pharmacologic comfort measures Outcome: Progressing   Problem: Health Behavior/Discharge Planning: Goal: Ability to manage health-related needs will improve Outcome: Progressing   Problem: Activity: Goal: Risk for activity intolerance will decrease Outcome: Progressing   Problem: Elimination: Goal: Will not experience complications related to urinary retention Outcome: Progressing   Problem: Safety: Goal: Ability to remain free from injury will improve Outcome: Progressing

## 2022-03-17 NOTE — Evaluation (Signed)
Physical Therapy Evaluation Patient Details Name: Jimmy Russell MRN: [id number] DOB: [birth year] Today's Date: 03/17/2022  History of Present Illness  Pt is a 88 y.o. M who presents 03/15/2022 with a fall. Found to have L femur intertrochanteric fractures now s/p cephalomedullary nailing. Significant PMH: CLL in remission, thrombocytopenia.  Clinical Impression  PTA, pt lives alone and is ambulatory with a RW. Pt presents with decreased functional mobility secondary to LLE weakness, pain, impaired sitting balance, and cognition. Pt requiring two person maximal assist for bed mobility and squat pivot transfer from bed to chair. Recommend ST SNF to address deficits, maximize functional mobility and decrease caregiver burden.     Recommendations for follow up therapy are one component of a multi-disciplinary discharge planning process, led by the attending physician.  Recommendations may be updated based on patient status, additional functional criteria and insurance authorization.  Follow Up Recommendations Skilled nursing-short term rehab (<3 hours/day) Can patient physically be transported by private vehicle: No    Assistance Recommended at Discharge Frequent or constant Supervision/Assistance  Patient can return home with the following  Two people to help with walking and/or transfers;A lot of help with bathing/dressing/bathroom    Equipment Recommendations Wheelchair (measurements PT);Wheelchair cushion (measurements PT);BSC/3in1;Hospital bed  Recommendations for Other Services       Functional Status Assessment Patient has had a recent decline in their functional status and demonstrates the ability to make significant improvements in function in a reasonable and predictable amount of time.     Precautions / Restrictions Precautions Precautions: Fall Restrictions Weight Bearing Restrictions: Yes LLE Weight Bearing: Weight bearing as tolerated      Mobility  Bed  Mobility Overal bed mobility: Needs Assistance Bed Mobility: Supine to Sit     Supine to sit: Max assist, +2 for physical assistance     General bed mobility comments: Helicopter method to progress to edge of bed as pt initially resistive and guarding heavily away from L hip    Transfers Overall transfer level: Needs assistance Equipment used: None Transfers: Bed to chair/wheelchair/BSC       Squat pivot transfers: Max assist, +2 physical assistance     General transfer comment: MaxA + 2 for squat pivot transfer from bed to chair towards right. Pt able to clear hips from surface    Ambulation/Gait               General Gait Details: unable  Stairs            Wheelchair Mobility    Modified Rankin (Stroke Patients Only)       Balance Overall balance assessment: Needs assistance Sitting-balance support: Feet supported Sitting balance-Leahy Scale: Poor Sitting balance - Comments: pt guarding L hip with right lateral lean, initially requiring maxA progressing to min guard assist                                     Pertinent Vitals/Pain Pain Assessment Pain Assessment: Faces Faces Pain Scale: Hurts worst Pain Location: LLE during mobility Pain Descriptors / Indicators: Grimacing, Guarding, Sharp Pain Intervention(s): Limited activity within patient's tolerance, Monitored during session, Premedicated before session, Repositioned, Other (comment) (RN notified)    Home Living Family/patient expects to be discharged to:: Private residence Living Arrangements: Alone Available Help at Discharge: Family             Home Equipment: Conservation officer, nature (2 wheels) Additional Comments: Poor historian.  Prior Function Prior Level of Function : Needs assist             Mobility Comments: utilizing RW per chart review. Pt states he walks 30 minutes per day but unsure of accuracy ADLs Comments: pt reports indep ADL's. states son comes  "every 3 weeks," to drop off supplies     Hand Dominance        Extremity/Trunk Assessment   Upper Extremity Assessment Upper Extremity Assessment: Defer to OT evaluation    Lower Extremity Assessment Lower Extremity Assessment: Generalized weakness;LLE deficits/detail LLE Deficits / Details: L intertrochanteric femur fx s/p nailing. 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 Perservating on needing "pee bucket," (urinal) to hold onto throughout. stating he wants to "go back home and get out of this hospital system," and reports he has enough food to last him a while        General Comments      Exercises     Assessment/Plan    PT Assessment Patient needs continued PT services  PT Problem List Decreased strength;Decreased activity tolerance;Decreased balance;Decreased mobility;Pain       PT Treatment Interventions DME instruction;Gait training;Functional mobility training;Therapeutic activities;Therapeutic exercise;Balance training;Patient/family education;Wheelchair mobility training    PT Goals (Current goals can be found in the Care Plan section)  Acute Rehab PT Goals Patient Stated Goal: go home PT Goal Formulation: With patient Time For Goal Achievement: 03/31/22 Potential to Achieve Goals: Fair    Frequency Min 3X/week     Co-evaluation PT/OT/SLP Co-Evaluation/Treatment: Yes Reason for Co-Treatment: For patient/therapist safety;To address functional/ADL transfers PT goals addressed during session: Mobility/safety with mobility          AM-PAC PT "6 Clicks" Mobility  Outcome Measure Help needed turning from your back to your side while in a flat bed without using bedrails?: A Lot Help needed moving from lying on your back to sitting on the side of a flat bed without using bedrails?: Total Help needed moving to and from a bed to a chair (including a wheelchair)?: Total Help needed standing up from a chair using your arms (e.g., wheelchair or bedside chair)?: Total Help needed to walk in hospital room?: Total Help needed climbing 3-5 steps with a railing? : Total 6 Click Score: 7    End of Session Equipment Utilized During Treatment: Gait belt Activity Tolerance: Patient limited by pain Patient left: in chair;with call bell/phone within reach;with chair alarm set Nurse Communication: Mobility status;Need for lift equipment PT Visit Diagnosis: Pain;Other abnormalities of gait and mobility (R26.89) Pain - Right/Left: Left Pain - part of body: Hip    Time: 2878-6767 PT Time Calculation (min) (ACUTE ONLY): 32 min   Charges:   PT Evaluation $PT Eval Moderate Complexity: 1 Mod          Wyona Almas, PT, DPT Acute Rehabilitation Services Office [phone]   Deno Etienne 03/17/2022, 1:47 PM

## 2022-03-17 NOTE — Evaluation (Signed)
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 male with past medical history relevant for thrombocytopenia,CLL (chronic lymphocytic leukemia) with Richter's Transformation -in Remission,-Prior valve surgery with bovine replacement who presents to the ED after mechanical fall at home with left hip pain after the fall. Underwent nailing of left intertrochanteric femur fracture.    Assessment / Plan / Recommendation  Clinical Impression  Pt seen for swallow assessment with pt having difficulty after surgery yesterday (specifics unknown). Pt is alert, demonstrating adequate ROM and strength without cranial nerve involvement. His volitional cough is strong and expectorating phlegm. Pt self fed cup/straw sips thin, puree and solid texture cracker with adequate bolus manipulation, transt and no oral residue. There was no SLP Visit Diagnosis: Dysphagia, unspecified (R13.10)    Aspiration Risk  Mild aspiration risk    Diet Recommendation Regular;Thin liquid   Liquid Administration via: Cup;Straw Medication Administration: Whole meds with liquid (or whole in puree if difficulty with thin) Supervision: Patient able to self feed Compensations: Slow rate;Small sips/bites (may need set up assist) Postural Changes: Seated upright at 90 degrees    Other  Recommendations Oral Care Recommendations: Oral care BID     Recommendations for follow up therapy are one component of a multi-disciplinary discharge planning process, led by the attending physician.  Recommendations may be updated based on patient status, additional functional criteria and insurance authorization.  Follow up Recommendations No SLP follow up      Assistance Recommended at Discharge None  Functional Status Assessment    Frequency and Duration            Prognosis        Swallow Study   General Date of Onset: 03/15/22 HPI: Jimmy Russell  is a 88 y.o. male with past medical history relevant for thrombocytopenia,CLL (chronic lymphocytic leukemia) with Richter's Transformation -in Remission,-Prior valve surgery with bovine replacement who presents to the ED after mechanical fall at home with left hip pain after the fall. Underwent nailing of left intertrochanteric femur fracture. Type of Study: Bedside Swallow Evaluation Previous Swallow Assessment:  (none) Diet Prior to this Study: Regular;Thin liquids Temperature Spikes Noted: No Respiratory Status: Room air History of Recent Intubation: Yes Length of Intubations (days):  (during surgery) Date extubated: 03/16/22 Behavior/Cognition: Alert;Cooperative;Pleasant mood Oral Cavity Assessment: Within Functional Limits Oral Care Completed by SLP: No Oral Cavity - Dentition: Dentures, top;Dentures, bottom Vision: Functional for self-feeding Self-Feeding Abilities: Able to feed self Patient Positioning: Upright in bed Baseline Vocal Quality: Normal Volitional Cough: Strong Volitional Swallow: Able to elicit    Oral/Motor/Sensory Function Overall Oral Motor/Sensory Function: Within functional limits   Ice Chips Ice chips: Not tested   Thin Liquid Thin Liquid: Within functional limits Presentation: Cup;Straw    Nectar Thick Nectar Thick Liquid: Not tested   Honey Thick Honey Thick Liquid: Not tested   Puree Puree: Within functional limits   Solid  Solid: Within  functional limits      Houston Siren 03/17/2022,11:51 AM

## 2022-03-17 NOTE — TOC Initial Note (Addendum)
Transition of Care [facility]) - Initial/Assessment Note    Patient Details  Name: Jimmy Russell MRN: [id number] Date of Birth: 11/01/1919  Transition of Care [facility]) CM/SW Contact:    Joanne Chars, LCSW Phone Number: 03/17/2022, 3:50 PM  Clinical Narrative:    CSW met with pt and son Jimmy Russell regarding DC recommendation for SNF.     Son is POA.  Permission given to speak with them.  Most info from [place] as pt somewhat sleepy.  They are in agreement with SNF and requesting [facility], which is walking distance from their home.  Pt lives alone, no services.  Pt is vaccinated for covid with one booster.  Referral sent out for SNF.      Spoke to [facility] center and they are full but she will review in case bed opens up.        Expected Discharge Plan: Skilled Nursing Facility Barriers to Discharge: Continued Medical Work up, SNF Pending bed offer   Patient Goals and CMS Choice Patient states their goals for this hospitalization and ongoing recovery are:: get home   Choice offered to / list presented to : Adult Children (son Jimmy Russell)  Expected Discharge Plan and Services Expected Discharge Plan: [place] In-house Referral: Clinical Social Work   Post Acute Care Choice: [place] Living arrangements for the past 2 months: [employer]                                      Prior Living Arrangements/Services Living arrangements for the past 2 months: Single Family Home Lives with:: Self Patient language and need for interpreter reviewed:: Yes Do you feel safe going back to the place where you live?: Yes      Need for Family Participation in Patient Care: Yes (Comment)   Current home services: Other (comment) (none) Criminal Activity/Legal Involvement Pertinent to Current Situation/Hospitalization: No - Comment as needed  Activities of Daily Living Home Assistive Devices/Equipment: Walker (specify type) (front wheel) ADL  Screening (condition at time of admission) Patient's cognitive ability adequate to safely complete daily activities?: Yes Is the patient deaf or have difficulty hearing?: Yes Does the patient have difficulty seeing, even when wearing glasses/contacts?: Yes Does the patient have difficulty concentrating, remembering, or making decisions?: Yes Patient able to express need for assistance with ADLs?: Yes Does the patient have difficulty dressing or bathing?: Yes Independently performs ADLs?: No Does the patient have difficulty walking or climbing stairs?: Yes Weakness of Legs: Left Weakness of Arms/Hands: Both  Permission Sought/Granted Permission sought to share information with : Family Supports Permission granted to share information with : Yes, Verbal Permission Granted  Share Information with NAME: son Jimmy Russell, [place]  Permission granted to share info w AGENCY: SNF        Emotional Assessment Appearance:: Appears stated age Attitude/Demeanor/Rapport: Lethargic Affect (typically observed): Pleasant Orientation: : Oriented to Self, Oriented to Place, Oriented to  Time, Oriented to Situation Alcohol / Substance Use: Not Applicable Psych Involvement: No (comment)  Admission diagnosis:  Hip fracture ([place]) [S72.009A] Closed fracture of left hip, initial encounter ([place]) [S72.002A] Fall, initial encounter [W19.XXXA] Patient Active Problem List   Diagnosis Date Noted   Anemia 03/16/2022   Thrombocytopenia ([place]) 03/16/2022   Hypotension 03/16/2022   Lt Hip fracture ([place]) 03/15/2022   Port catheter in place 09/12/2012   CLL (chronic lymphocytic leukemia) with Richter's  Transformation -in Remission 09/02/2011   PCP:  Celene Squibb, MD Pharmacy:   [id number] [place] [id number]: [phone]: [phone]    Social Determinants of Health (SDOH) Interventions    Readmission Risk  Interventions     No data to display

## 2022-03-17 NOTE — Progress Notes (Signed)
PROGRESS NOTE    Jimmy Russell  PPI:[id number] DOB: 1919/08/04 DOA: 03/15/2022 PCP: Jimmy Squibb, MD  Chief Complaint  Patient presents with   Fall    Left hip deformity    Brief Narrative:  Jimmy Russell  is Jimmy Russell 88 y.o. male with past medical history relevant for thrombocytopenia,CLL (chronic lymphocytic leukemia) with Jimmy Russell -in Remission,-Prior valve surgery with bovine replacement who presents to the ED after mechanical fall at home with left hip pain after the fall -Denies loss of consciousness chest pains palpitations or dizziness before after fall,, no concerns about seizures -- Apparently he was not using his walker -Additional history obtained from patient's son and daughter-in-law at bedside -No concerns about head injury -Cognitively according to patient's son and daughter-in-law patient appears to be at baseline -Hgb 12.6 -WBC 9.8.Marland Kitchen   -Platelets 140, INR 1.3 -Patient appears to have prior history of thrombocytopenia--- -No Bleeding concerns at this time monitor closely -EKG shows irregular rhythm with PVCs and left anterior fascicular block -Chest x-ray without acute findings -Hip Xrays--Comminuted left femur intertrochanteric fracture with varus impaction. -EDP discussed case with on-call orthopedic surgeon at [facility] who recommended transfer to Jimmy Russell for operative/orthopedic intervention    Assessment & Plan:   Principal Problem:   Lt Hip fracture ([place]) Active Problems:   CLL (chronic lymphocytic leukemia) with Jimmy Russell -in Remission   Hypotension   Anemia   Thrombocytopenia (HCC)   Assessment and Plan: * Lt Hip fracture ([place]) Plain films with comminuted L femur intertrochanteric fractures with varus impaction S/p cephalomedullary nailing of L intertrochanteric femur fracture Post op pain management, dvt ppx per orthopedics Bowel regimen  Hypotension Improved today He was on phenylephrine post op, also got  albumin Will continue to monitor, trend Hb/Hct post op Bolus as needed if worsening or symptomatic  CLL (chronic lymphocytic leukemia) with Jimmy Russell -in Remission noted  Anemia Post op drop in Hb, trend May need transfusion if hypotensive/symptomaticx or Hb <7  Thrombocytopenia ([place]) Downtrending, will follow closely     DVT prophylaxis: lovenox Code Status: full Family Communication: none Disposition:   Status is: Inpatient Remains inpatient appropriate because: pending therapy eval   Consultants:  ortho  Procedures:  Cephalomedullary nailing of left intertrochanteric femur fracture  Antimicrobials:  Anti-infectives (From admission, onward)    Start     Dose/Rate Route Frequency Ordered Stop   03/16/22 1645  ceFAZolin (ANCEF) IVPB 2g/100 mL premix        2 g 200 mL/hr over 30 Minutes Intravenous Every 8 hours 03/16/22 1556 03/17/22 0715       Subjective: C/o some pain No other complaints  Objective: Vitals:   03/17/22 0055 03/17/22 0510 03/17/22 0910 03/17/22 1451  BP: 99/60 (!) 95/56 (!) 107/47 (!) 115/59  Pulse: 79  99 60  Resp: 18 14 (!) 21   Temp:  98.3 F (36.8 C) 97.9 F (36.6 C) 97.6 F (36.4 C)  TempSrc:  Oral Oral Oral  SpO2: 100%  99% 99%  Weight:      Height:        Intake/Output Summary (Last 24 hours) at 03/17/2022 1958 Last data filed at 03/17/2022 1600 Gross per 24 hour  Intake 183 ml  Output 50 ml  Net 133 ml   Filed Weights   03/15/22 0702 03/16/22 0848  Weight: 60.3 kg 60.3 kg    Examination:  General: No acute distress. Cardiovascular:RRR Lungs: unlabored Abdomen: Soft, nontender, nondistended  Neurological: Alert and oriented 3.  Moves all extremities 4 with equal strength. Cranial nerves II through XII grossly intact. Extremities: dressing intact to LLE   Data Reviewed: I have personally reviewed following labs and imaging studies  CBC: Recent Labs  Lab 03/15/22 0734 03/16/22 0302  03/16/22 1528 03/17/22 0855 03/17/22 1619  WBC 9.8 9.3 8.2 7.9  --   NEUTROABS 8.4*  --   --   --   --   HGB 12.6* 10.8* 8.7* 7.3* 7.2*  HCT 36.9* 31.1* 27.0* 21.2* 20.4*  MCV 94.4 93.4 101.1* 95.5  --   PLT 140* 124* 83* 91*  --     Basic Metabolic Panel: Recent Labs  Lab 03/15/22 0734 03/17/22 0855  NA 136 135  K 3.6 3.9  CL 102 102  CO2 28 24  GLUCOSE 142* 121*  BUN 19 21  CREATININE 0.70 0.81  CALCIUM 8.6* 8.4*    GFR: Estimated Creatinine Clearance: 42.4 mL/min (by C-G formula based on SCr of 0.81 mg/dL).  Liver Function Tests: No results for input(s): "AST", "ALT", "ALKPHOS", "BILITOT", "PROT", "ALBUMIN" in the last 168 hours.  CBG: No results for input(s): "GLUCAP" in the last 168 hours.   Recent Results (from the past 240 hour(s))  Surgical pcr screen     Status: None   Collection Time: 03/15/22 10:10 PM   Specimen: Nasal Mucosa; Nasal Swab  Result Value Ref Range Status   MRSA, PCR NEGATIVE NEGATIVE Final   Staphylococcus aureus NEGATIVE NEGATIVE Final    Comment: (NOTE) The Xpert SA Assay (FDA approved for NASAL specimens in patients 54 years of age and older), is one component of Jimmy Russell comprehensive surveillance program. It is not intended to diagnose infection nor to guide or monitor treatment. Performed at [facility], [place] [address], [place], [place] [address]          Radiology Studies: DG Pelvis 1-2 Views  Result Date: 03/16/2022 CLINICAL DATA:  Postop EXAM: PELVIS - 1-2 VIEW; LEFT FEMUR 2 VIEWS COMPARISON:  No radiographs 03/15/2022 FINDINGS: Interval postoperative change of intramedullary rod and screw fixation across Jimmy Russell comminuted left intertrochanteric femur fracture. Improved alignment. No new fracture. Subcutaneous emphysema. IMPRESSION: Expected postoperative changes of IM rod and screw fixation across Jimmy Russell comminuted left intertrochanteric femur fracture. Electronically Signed   By: Jimmy Russell M.D.   On: 03/16/2022 18:12   DG  FEMUR MIN 2 VIEWS LEFT  Result Date: 03/16/2022 CLINICAL DATA:  Postop EXAM: PELVIS - 1-2 VIEW; LEFT FEMUR 2 VIEWS COMPARISON:  No radiographs 03/15/2022 FINDINGS: Interval postoperative change of intramedullary rod and screw fixation across Shukri Nistler comminuted left intertrochanteric femur fracture. Improved alignment. No new fracture. Subcutaneous emphysema. IMPRESSION: Expected postoperative changes of IM rod and screw fixation across Braxxton Stoudt comminuted left intertrochanteric femur fracture. Electronically Signed   By: Jimmy Russell M.D.   On: 03/16/2022 18:12   DG FEMUR MIN 2 VIEWS LEFT  Result Date: 03/16/2022 CLINICAL DATA:  Left femoral intramedullary nail placement EXAM: LEFT FEMUR 2 VIEWS COMPARISON:  03/15/2022 FINDINGS: Eight fluoroscopic images are obtained during the performance of the procedure and are provided for interpretation only. Intramedullary rod with proximal dynamic and distal interlocking screws traverse the intertrochanteric left hip fracture seen previously. Alignment is near anatomic. Please refer to the operative report. Fluoroscopy time: 2 minutes 24 seconds, 16.78 mGy IMPRESSION: 1. ORIF left hip fracture as above. Electronically Signed   By: Randa Ngo M.D.   On: 03/16/2022 12:49   DG C-Arm 1-60 Min-No Report  Result Date:  03/16/2022 Fluoroscopy was utilized by the requesting physician.  No radiographic interpretation.   DG C-Arm 1-60 Min-No Report  Result Date: 03/16/2022 Fluoroscopy was utilized by the requesting physician.  No radiographic interpretation.        Scheduled Meds:  aspirin EC  81 mg Oral Daily   enoxaparin (LOVENOX) injection  40 mg Subcutaneous Q24H   feeding supplement  237 mL Oral BID   ferrous sulfate  325 mg Oral QODAY   methocarbamol  500 mg Oral TID   multivitamin with minerals   Oral Daily   polyethylene glycol  17 g Oral Daily   polyvinyl alcohol  1 drop Both Eyes q morning   senna-docusate  2 tablet Oral BID   sodium chloride flush  3  mL Intravenous Q12H   sodium chloride flush  3 mL Intravenous Q12H   Continuous Infusions:  sodium chloride       LOS: 2 days    Time spent: over 30 min    Fayrene Helper, MD [facility]   To contact the attending provider between 7A-7P or the covering provider during after hours 7P-7A, please log into the web site [email or web address] and access using universal [place] password for that web site. If you do not have the password, please call the hospital operator.  03/17/2022, 7:58 PM

## 2022-03-17 NOTE — Progress Notes (Signed)
Orthopaedic Trauma Progress Note  SUBJECTIVE: Doing okay this morning.  Notes moderate amount of pain in the left hip.  About to receive dose of pain medication.  No chest pain. No SOB. No nausea/vomiting. No other complaints.  Has not been up out of bed yet since surgery  OBJECTIVE:  Vitals:   03/17/22 0055 03/17/22 0510  BP: 99/60 (!) 95/56  Pulse: 79   Resp: 18 14  Temp:  98.3 F (36.8 C)  SpO2: 100%     General: Laying in bed comfortably, no acute distress Respiratory: No increased work of breathing.  Left lower extremity: Dressings clean, dry, intact.  Tenderness over the hip and throughout the thigh as expected.  Ankle DF/PF intact.  Did not attempt knee motion secondary to patient having pain at rest.  Endorses sensation of light touch over all aspects of the foot.  Neurovascularly intact.  IMAGING: Stable post op imaging.   LABS:  Results for orders placed or performed during the hospital encounter of 03/15/22 (from the past 24 hour(s))  CBC     Status: Abnormal   Collection Time: 03/16/22  3:28 PM  Result Value Ref Range   WBC 8.2 4.0 - 10.5 K/uL   RBC 2.67 (L) 4.22 - 5.81 MIL/uL   Hemoglobin 8.7 (L) 13.0 - 17.0 g/dL   HCT 27.0 (L) 39.0 - 52.0 %   MCV 101.1 (H) 80.0 - 100.0 fL   MCH 32.6 26.0 - 34.0 pg   MCHC 32.2 30.0 - 36.0 g/dL   RDW 12.8 11.5 - 15.5 %   Platelets 83 (L) 150 - 400 K/uL   nRBC 0.0 0.0 - 0.2 %    ASSESSMENT: Jimmy Russell is a 88 y.o. male, 1 Day Post-Op s/p INTRAMEDULLARY NAIL LEFT INTERTROCHANTERIC FEMUR FRACTURE   CV/Blood loss: Hemoglobin 8.7 postoperatively on 03/16/2022, dropped from preoperatively.  CBC pending this AM.  Hemodynamically stable  PLAN: Weightbearing: WBAT LLE ROM: Okay for unrestricted hip and knee motion as tolerated Incisional and dressing care: Reinforce dressings as needed.  Plan to remove dressings 03/18/2022 Showering: Okay to begin showering and getting incisions wet 03/19/2022 Orthopedic device(s): None  Pain  management:  1. Tylenol 650 mg q 6 hours PRN 2. Robaxin 500 mg 3 times daily 3. Oxycodone 5 mg q 4 hours PRN 4. Fentanyl 25 mcg q 2 hours PRN VTE prophylaxis: Lovenox, SCDs ID:  Ancef 2gm post op Foley/Lines:  No foley, KVO IVFs Impediments to Fracture Healing: Vitamin D level pending, will start supplementation as indicated Dispo: PT/OT evaluation today, dispo pending.  Plan to remove dressings LLE tomorrow.  Continue to monitor CBC   D/C recommendations: - Oxycodone for pain control -Aspirin for DVT prophylaxis -Possible need for Vit D supplementation  Follow - up plan: 2 weeks after discharge for wound check and repeat x-rays   Contact information:  Katha Hamming MD, Rushie Nyhan PA-C. After hours and holidays please check [email or web address] for group call information for [facility]   Gwinda Passe, PA-C [phone] (office) [email or web address]

## 2022-03-18 ENCOUNTER — Encounter (HOSPITAL_COMMUNITY): Payer: Self-pay | Admitting: Student

## 2022-03-18 DIAGNOSIS — S72002A Fracture of unspecified part of neck of left femur, initial encounter for closed fracture: Secondary | ICD-10-CM | POA: Diagnosis not present

## 2022-03-18 LAB — CBC WITH DIFFERENTIAL/PLATELET
Abs Immature Granulocytes: 0.03 10*3/uL (ref 0.00–0.07)
Basophils Absolute: 0 10*3/uL (ref 0.0–0.1)
Basophils Relative: 0 %
Eosinophils Absolute: 0 10*3/uL (ref 0.0–0.5)
Eosinophils Relative: 0 %
HCT: 21.4 % — ABNORMAL LOW (ref 39.0–52.0)
Hemoglobin: 7.3 g/dL — ABNORMAL LOW (ref 13.0–17.0)
Immature Granulocytes: 0 %
Lymphocytes Relative: 18 %
Lymphs Abs: 1.4 10*3/uL (ref 0.7–4.0)
MCH: 32.2 pg (ref 26.0–34.0)
MCHC: 34.1 g/dL (ref 30.0–36.0)
MCV: 94.3 fL (ref 80.0–100.0)
Monocytes Absolute: 0.8 10*3/uL (ref 0.1–1.0)
Monocytes Relative: 10 %
Neutro Abs: 5.4 10*3/uL (ref 1.7–7.7)
Neutrophils Relative %: 72 %
Platelets: 102 10*3/uL — ABNORMAL LOW (ref 150–400)
RBC: 2.27 MIL/uL — ABNORMAL LOW (ref 4.22–5.81)
RDW: 12.8 % (ref 11.5–15.5)
WBC: 7.7 10*3/uL (ref 4.0–10.5)
nRBC: 0 % (ref 0.0–0.2)

## 2022-03-18 LAB — COMPREHENSIVE METABOLIC PANEL
ALT: 18 U/L (ref 0–44)
AST: 62 U/L — ABNORMAL HIGH (ref 15–41)
Albumin: 2.7 g/dL — ABNORMAL LOW (ref 3.5–5.0)
Alkaline Phosphatase: 35 U/L — ABNORMAL LOW (ref 38–126)
Anion gap: 6 (ref 5–15)
BUN: 27 mg/dL — ABNORMAL HIGH (ref 8–23)
CO2: 27 mmol/L (ref 22–32)
Calcium: 8.3 mg/dL — ABNORMAL LOW (ref 8.9–10.3)
Chloride: 102 mmol/L (ref 98–111)
Creatinine, Ser: 0.74 mg/dL (ref 0.61–1.24)
GFR, Estimated: >60 mL/min (ref >60)
Glucose, Bld: 115 mg/dL — ABNORMAL HIGH (ref 70–99)
Potassium: 4 mmol/L (ref 3.5–5.1)
Sodium: 135 mmol/L (ref 135–145)
Total Bilirubin: 1.6 mg/dL — ABNORMAL HIGH (ref 0.3–1.2)
Total Protein: 4.5 g/dL — ABNORMAL LOW (ref 6.5–8.1)

## 2022-03-18 LAB — MAGNESIUM: Magnesium: 1.8 mg/dL (ref 1.7–2.4)

## 2022-03-18 LAB — PHOSPHORUS: Phosphorus: 2.2 mg/dL — ABNORMAL LOW (ref 2.5–4.6)

## 2022-03-18 LAB — HEMOGLOBIN AND HEMATOCRIT, BLOOD
HCT: 23.9 % — ABNORMAL LOW (ref 39.0–52.0)
Hemoglobin: 8.3 g/dL — ABNORMAL LOW (ref 13.0–17.0)

## 2022-03-18 LAB — ABO/RH: ABO/RH(D): O POS

## 2022-03-18 LAB — PREPARE RBC (CROSSMATCH)

## 2022-03-18 MED ORDER — ACETAMINOPHEN 325 MG PO TABS: 650.0000 mg | ORAL_TABLET | Freq: Four times a day (QID) | ORAL | Status: DC | PRN

## 2022-03-18 MED ORDER — ASPIRIN 325 MG PO TBEC: 325.0000 mg | DELAYED_RELEASE_TABLET | Freq: Every day | ORAL | 0 refills | Status: AC

## 2022-03-18 MED ORDER — VITAMIN D (CHOLECALCIFEROL) 25 MCG (1000 UT) PO TABS
1.0000 | ORAL_TABLET | Freq: Every day | ORAL | 1 refills | Status: DC
Start: 2022-03-18 — End: 2022-12-16

## 2022-03-18 MED ORDER — OXYCODONE HCL 5 MG PO TABS: 5.0000 mg | ORAL_TABLET | ORAL | 0 refills | Status: DC | PRN

## 2022-03-18 MED ORDER — SODIUM CHLORIDE 0.9% IV SOLUTION: Freq: Once | INTRAVENOUS | Status: AC

## 2022-03-18 NOTE — Care Management Important Message (Signed)
Important Message  Patient Details  Name: Jimmy Russell MRN: [id number] Date of Birth: [birth year]   Medicare Important Message Given:  Yes     Hannah Beat 03/18/2022, 2:04 PM

## 2022-03-18 NOTE — Progress Notes (Signed)
Occupational Therapy Treatment Patient Details Name: Jimmy Russell MRN: [id number] DOB: 30-Sep-1919 Today's Date: 03/18/2022   History of present illness 88 y.o. M who presents 03/15/2022 with a fall. Found to have L femur intertrochanteric fractures now s/p cephalomedullary nailing. Significant PMH: CLL in remission, thrombocytopenia.   OT comments  Patient supine and moved through [facility] to left leg prior to EOB sitting and standing.  Patient needing Mod A for supine to sit with HOB elevated and increased time.  Able to sit to stand with Max A times two, and shuffle to the [facility] about 10" at RW level.  Patient needing assist to side RW and cues to move feet.  Patient ultimately rolled to left side and positioned for comfort.  OT can continue efforts in the acute setting with SNF recommended for post acute rehab.     Recommendations for follow up therapy are one component of a multi-disciplinary discharge planning process, led by the attending physician.  Recommendations may be updated based on patient status, additional functional criteria and insurance authorization.    Follow Up Recommendations  Skilled nursing-short term rehab (<3 hours/day)    Assistance Recommended at Discharge Frequent or constant Supervision/Assistance  Patient can return home with the following  Two people to help with walking and/or transfers;Assistance with cooking/housework;Assistance with feeding;Assist for transportation;A lot of help with bathing/dressing/bathroom   Equipment Recommendations  BSC/3in1;Wheelchair (measurements OT);Wheelchair cushion (measurements OT);Hospital bed    Recommendations for Other Services      Precautions / Restrictions Precautions Precautions: Fall Restrictions LLE Weight Bearing: Weight bearing as tolerated       Mobility Bed Mobility Overal bed mobility: Needs Assistance Bed Mobility: Supine to Sit, Sit to Supine     Supine to sit: Mod assist, HOB elevated Sit to  supine: Max assist        Transfers Overall transfer level: Needs assistance Equipment used: Rolling walker (2 wheels) Transfers: Sit to/from Stand Sit to Stand: Max assist           General transfer comment: able to take sliding side steps to [facility]     Balance Overall balance assessment: Needs assistance Sitting-balance support: Feet supported Sitting balance-Leahy Scale: Fair     Standing balance support: Reliant on assistive device for balance Standing balance-Leahy Scale: Poor                                 Cognition Arousal/Alertness: Awake/alert Behavior During Therapy: WFL for tasks assessed/performed Overall Cognitive Status: No family/caregiver present to determine baseline cognitive functioning                                                             Pertinent Vitals/ Pain       Pain Assessment Faces Pain Scale: Hurts even more Pain Location: LLE during mobility Pain Descriptors / Indicators: Grimacing, Guarding Pain Intervention(s): Monitored during session                                                          Frequency  Min 2X/week  Progress Toward Goals  OT Goals(current goals can now be found in the care plan section)  Progress towards OT goals: Progressing toward goals  Acute Rehab OT Goals Time For Goal Achievement: 03/31/22 Potential to Achieve Goals: Good  Plan      Co-evaluation                 AM-PAC OT "6 Clicks" Daily Activity     Outcome Measure   Help from another person eating meals?: A Little Help from another person taking care of personal grooming?: A Little Help from another person toileting, which includes using toliet, bedpan, or urinal?: A Lot Help from another person bathing (including washing, rinsing, drying)?: A Lot Help from another person to put on and taking off regular upper body clothing?: A Lot Help from another person to  put on and taking off regular lower body clothing?: A Lot 6 Click Score: 14    End of Session Equipment Utilized During Treatment: Gait belt  OT Visit Diagnosis: Unsteadiness on feet (R26.81);Muscle weakness (generalized) (M62.81);Pain Pain - Right/Left: Left Pain - part of body: Hip   Activity Tolerance Patient tolerated treatment well   Patient Left in bed;with call bell/phone within reach   Nurse Communication Other (comment) (patient positioned on opposite side)        Time: 9163-8466 OT Time Calculation (min): 19 min  Charges: OT General Charges $OT Visit: 1 Visit OT Treatments $Therapeutic Activity: 8-22 mins  03/18/2022  RP, OTR/L  Acute Rehabilitation Services  Office:  [phone]   Metta Clines 03/18/2022, 4:59 PM

## 2022-03-18 NOTE — Plan of Care (Signed)
  Problem: Education: Goal: Knowledge of General Education information will improve Description: Including pain rating scale, medication(s)/side effects and non-pharmacologic comfort measures Outcome: Progressing   Problem: Health Behavior/Discharge Planning: Goal: Ability to manage health-related needs will improve Outcome: Progressing   Problem: Clinical Measurements: Goal: Diagnostic test results will improve Outcome: Progressing   Problem: Nutrition: Goal: Adequate nutrition will be maintained Outcome: Progressing   Problem: Safety: Goal: Ability to remain free from injury will improve Outcome: Progressing

## 2022-03-18 NOTE — Plan of Care (Signed)

## 2022-03-18 NOTE — Progress Notes (Signed)
PROGRESS NOTE    Jimmy Russell  PZW:[id number] DOB: September 24, 1919 DOA: 03/15/2022  PCP: Celene Squibb, MD   Brief Narrative:  This 88 years old male with PMH significant for thrombocytopenia, CLL with Richter transformation-in remission, prior valve surgery with bovine replacement presented in the ED s/p mechanical fall at home with left hip pain after the fall.  X-ray confirms left femur intertrochanteric fracture.  Orthopedic consulted Patient underwent ORIF (cephalomedullary nailing).  Postoperative day 2.  Tolerated well, PT and OT evaluation pending.  Assessment & Plan:   Principal Problem:   Lt Hip fracture ([place]) Active Problems:   CLL (chronic lymphocytic leukemia) with Richter's Transformation -in Remission   Hypotension   Anemia   Thrombocytopenia (HCC)  Left femur fracture: Patient presented s/p mechanical fall at home with a left intertrochanteric fracture with varus impaction. Orthopedic consulted,  Patient underwent cephalomedullary nailing of left intertrochanteric fracture. Postoperative day 2.  Adequate pain control with pain meds. Continue DVT prophylaxis as per Ortho.  Bowel regimen. PT recommended skilled nursing facility for rehab.  Hypotension:  Blood pressure remains on the lower side. Patient was on phenylephrine postop for brief period.Marland Kitchen   He also received albumin infusion. Hold blood pressure medications.  CLL: Stable.  Outpatient follow-up  Acute blood loss anemia: Hemoglobin dropped postoperatively.10.6 > 7.3 Transfuse 1 unit PRBC, follow-up posttransfusion H&H  Thrombocytopenia: Follow closely.   DVT prophylaxis: Lovenox Code Status:Full code. Family Communication:No family at bed side. Disposition Plan:   Status is: Inpatient Remains inpatient appropriate because: Admitted s/p mechanical fall with left femur fracture, s/p ORIF.   Postoperative anemia requiring IV blood transfusion. Plan for SNF in 1 to 2 days.   Consultants:   Orthopedics  Procedures: ORIF. Antimicrobials:  Anti-infectives (From admission, onward)    Start     Dose/Rate Route Frequency Ordered Stop   03/16/22 1645  ceFAZolin (ANCEF) IVPB 2g/100 mL premix        2 g 200 mL/hr over 30 Minutes Intravenous Every 8 hours 03/16/22 1556 03/17/22 0715        Subjective: Patient was seen and examined at bedside.  Overnight events noted. Patient reports doing better, he is alert, following commands. He reports pain is reasonably controlled.  Postoperative day 2.  Objective: Vitals:   03/17/22 2001 03/17/22 2149 03/18/22 0434 03/18/22 0803  BP: (!) 112/49 115/60 (!) 106/45 (!) 122/43  Pulse: 61 71 (!) 56 (!) 59  Resp: '20 18 14 17  '$ Temp: 97.9 F (36.6 C) 98.2 F (36.8 C)  97.8 F (36.6 C)  TempSrc:  Oral  Oral  SpO2: 90% 100% 99% 100%  Weight:      Height:        Intake/Output Summary (Last 24 hours) at 03/18/2022 1113 Last data filed at 03/18/2022 1037 Gross per 24 hour  Intake 66 ml  Output 50 ml  Net 16 ml   Filed Weights   03/15/22 0702 03/16/22 0848  Weight: 60.3 kg 60.3 kg    Examination:  General exam: Appears comfortable, not in any acute distress.  Deconditioned. Respiratory system: CTA bilaterally, no wheezing, no crackles, normal respiratory effort. Cardiovascular system: S1 & S2 heard, regular rate and rhythm, no murmur.   Gastrointestinal system: Abdomen is soft, non tender, non distended, BS+. Central nervous system: Alert and oriented x 2. No focal neurological deficits. Extremities: S/p ORIF left lower extremity dressing noted.  Able to move left leg. Skin: No rashes, lesions or ulcers Psychiatry: Judgement and insight appear normal.  Mood & affect appropriate.     Data Reviewed: I have personally reviewed following labs and imaging studies  CBC: Recent Labs  Lab 03/15/22 0734 03/16/22 0302 03/16/22 1528 03/17/22 0855 03/17/22 1619 03/18/22 0438  WBC 9.8 9.3 8.2 7.9  --  7.7  NEUTROABS 8.4*  --    --   --   --  5.4  HGB 12.6* 10.8* 8.7* 7.3* 7.2* 7.3*  HCT 36.9* 31.1* 27.0* 21.2* 20.4* 21.4*  MCV 94.4 93.4 101.1* 95.5  --  94.3  PLT 140* 124* 83* 91*  --  956*   Basic Metabolic Panel: Recent Labs  Lab 03/15/22 0734 03/17/22 0855 03/18/22 0438  NA 136 135 135  K 3.6 3.9 4.0  CL 102 102 102  CO2 '28 24 27  '$ GLUCOSE 142* 121* 115*  BUN 19 21 27*  CREATININE 0.70 0.81 0.74  CALCIUM 8.6* 8.4* 8.3*  MG  --   --  1.8  PHOS  --   --  2.2*   GFR: Estimated Creatinine Clearance: 42.9 mL/min (by C-G formula based on SCr of 0.74 mg/dL). Liver Function Tests: Recent Labs  Lab 03/18/22 0438  AST 62*  ALT 18  ALKPHOS 35*  BILITOT 1.6*  PROT 4.5*  ALBUMIN 2.7*   No results for input(s): "LIPASE", "AMYLASE" in the last 168 hours. No results for input(s): "AMMONIA" in the last 168 hours. Coagulation Profile: Recent Labs  Lab 03/15/22 0734  INR 1.3*   Cardiac Enzymes: No results for input(s): "CKTOTAL", "CKMB", "CKMBINDEX", "TROPONINI" in the last 168 hours. BNP (last 3 results) No results for input(s): "PROBNP" in the last 8760 hours. HbA1C: No results for input(s): "HGBA1C" in the last 72 hours. CBG: No results for input(s): "GLUCAP" in the last 168 hours. Lipid Profile: No results for input(s): "CHOL", "HDL", "LDLCALC", "TRIG", "CHOLHDL", "LDLDIRECT" in the last 72 hours. Thyroid Function Tests: No results for input(s): "TSH", "T4TOTAL", "FREET4", "T3FREE", "THYROIDAB" in the last 72 hours. Anemia Panel: No results for input(s): "VITAMINB12", "FOLATE", "FERRITIN", "TIBC", "IRON", "RETICCTPCT" in the last 72 hours. Sepsis Labs: No results for input(s): "PROCALCITON", "LATICACIDVEN" in the last 168 hours.  Recent Results (from the past 240 hour(s))  Surgical pcr screen     Status: None   Collection Time: 03/15/22 10:10 PM   Specimen: Nasal Mucosa; Nasal Swab  Result Value Ref Range Status   MRSA, PCR NEGATIVE NEGATIVE Final   Staphylococcus aureus NEGATIVE  NEGATIVE Final    Comment: (NOTE) The Xpert SA Assay (FDA approved for NASAL specimens in patients 32 years of age and older), is one component of a comprehensive surveillance program. It is not intended to diagnose infection nor to guide or monitor treatment. Performed at [facility], [place] [address], [place], [place] [address]     Radiology Studies: DG Pelvis 1-2 Views  Result Date: 03/16/2022 CLINICAL DATA:  Postop EXAM: PELVIS - 1-2 VIEW; LEFT FEMUR 2 VIEWS COMPARISON:  No radiographs 03/15/2022 FINDINGS: Interval postoperative change of intramedullary rod and screw fixation across a comminuted left intertrochanteric femur fracture. Improved alignment. No new fracture. Subcutaneous emphysema. IMPRESSION: Expected postoperative changes of IM rod and screw fixation across a comminuted left intertrochanteric femur fracture. Electronically Signed   By: Placido Sou M.D.   On: 03/16/2022 18:12   DG FEMUR MIN 2 VIEWS LEFT  Result Date: 03/16/2022 CLINICAL DATA:  Postop EXAM: PELVIS - 1-2 VIEW; LEFT FEMUR 2 VIEWS COMPARISON:  No radiographs 03/15/2022 FINDINGS: Interval postoperative change of intramedullary  rod and screw fixation across a comminuted left intertrochanteric femur fracture. Improved alignment. No new fracture. Subcutaneous emphysema. IMPRESSION: Expected postoperative changes of IM rod and screw fixation across a comminuted left intertrochanteric femur fracture. Electronically Signed   By: Placido Sou M.D.   On: 03/16/2022 18:12   DG FEMUR MIN 2 VIEWS LEFT  Result Date: 03/16/2022 CLINICAL DATA:  Left femoral intramedullary nail placement EXAM: LEFT FEMUR 2 VIEWS COMPARISON:  03/15/2022 FINDINGS: Eight fluoroscopic images are obtained during the performance of the procedure and are provided for interpretation only. Intramedullary rod with proximal dynamic and distal interlocking screws traverse the intertrochanteric left hip fracture seen previously. Alignment is near  anatomic. Please refer to the operative report. Fluoroscopy time: 2 minutes 24 seconds, 16.78 mGy IMPRESSION: 1. ORIF left hip fracture as above. Electronically Signed   By: Randa Ngo M.D.   On: 03/16/2022 12:49   DG C-Arm 1-60 Min-No Report  Result Date: 03/16/2022 Fluoroscopy was utilized by the requesting physician.  No radiographic interpretation.   DG C-Arm 1-60 Min-No Report  Result Date: 03/16/2022 Fluoroscopy was utilized by the requesting physician.  No radiographic interpretation.    Scheduled Meds:  sodium chloride   Intravenous Once   aspirin EC  81 mg Oral Daily   enoxaparin (LOVENOX) injection  40 mg Subcutaneous Q24H   feeding supplement  237 mL Oral BID   ferrous sulfate  325 mg Oral QODAY   methocarbamol  500 mg Oral TID   multivitamin with minerals   Oral Daily   polyethylene glycol  17 g Oral Daily   polyvinyl alcohol  1 drop Both Eyes q morning   senna-docusate  2 tablet Oral BID   sodium chloride flush  3 mL Intravenous Q12H   sodium chloride flush  3 mL Intravenous Q12H   Continuous Infusions:  sodium chloride       LOS: 3 days    Time spent: 35 mins    Aki Burdin, MD [facility]   If 7PM-7AM, please contact night-coverage

## 2022-03-18 NOTE — TOC CAGE-AID Note (Signed)
Transition of Care [facility]) - CAGE-AID Screening   Patient Details  Name: Jimmy Russell MRN: [id number] Date of Birth: 17-Feb-1920  Transition of Care [facility]) CM/SW Contact:    Coralee Pesa, [place] Phone Number: 03/18/2022, 11:45 AM   Clinical Narrative:  CSW met with pt to complete CAGE- AID assessment. Pt initially responsive, but then stated he was sleepy, and went to sleep. Unable to complete assessment.  CAGE-AID Screening: Substance Abuse Screening unable to be completed due to: : Patient unable to participate

## 2022-03-18 NOTE — Progress Notes (Signed)
Orthopaedic Trauma Progress Note  SUBJECTIVE: Patient be doing well this morning.  Sleeping comfortably in bed.  Did not wake patient for exam.  No acute events noted overnight.  OBJECTIVE:  Vitals:   03/18/22 1118 03/18/22 1142  BP: (!) 106/52 (!) 98/45  Pulse: 64 70  Resp: 14 14  Temp: 97.6 F (36.4 C) 97.6 F (36.4 C)  SpO2: 100% 99%    General: Sleeping, no acute distress Respiratory: No increased work of breathing.  Left lower extremity: Dressings removed, incisions clean, dry, intact.  Foot warm and well-perfused.  Compartments soft and compressible.  2+ DP pulse  IMAGING: Stable post op imaging.   LABS:  Results for orders placed or performed during the hospital encounter of 03/15/22 (from the past 24 hour(s))  Hemoglobin and hematocrit, blood     Status: Abnormal   Collection Time: 03/17/22  4:19 PM  Result Value Ref Range   Hemoglobin 7.2 (L) 13.0 - 17.0 g/dL   HCT 20.4 (L) 39.0 - 52.0 %  Comprehensive metabolic panel     Status: Abnormal   Collection Time: 03/18/22  4:38 AM  Result Value Ref Range   Sodium 135 135 - 145 mmol/L   Potassium 4.0 3.5 - 5.1 mmol/L   Chloride 102 98 - 111 mmol/L   CO2 27 22 - 32 mmol/L   Glucose, Bld 115 (H) 70 - 99 mg/dL   BUN 27 (H) 8 - 23 mg/dL   Creatinine, Ser 0.74 0.61 - 1.24 mg/dL   Calcium 8.3 (L) 8.9 - 10.3 mg/dL   Total Protein 4.5 (L) 6.5 - 8.1 g/dL   Albumin 2.7 (L) 3.5 - 5.0 g/dL   AST 62 (H) 15 - 41 U/L   ALT 18 0 - 44 U/L   Alkaline Phosphatase 35 (L) 38 - 126 U/L   Total Bilirubin 1.6 (H) 0.3 - 1.2 mg/dL   GFR, Estimated >60 >60 mL/min   Anion gap 6 5 - 15  CBC with Differential/Platelet     Status: Abnormal   Collection Time: 03/18/22  4:38 AM  Result Value Ref Range   WBC 7.7 4.0 - 10.5 K/uL   RBC 2.27 (L) 4.22 - 5.81 MIL/uL   Hemoglobin 7.3 (L) 13.0 - 17.0 g/dL   HCT 21.4 (L) 39.0 - 52.0 %   MCV 94.3 80.0 - 100.0 fL   MCH 32.2 26.0 - 34.0 pg   MCHC 34.1 30.0 - 36.0 g/dL   RDW 12.8 11.5 - 15.5 %    Platelets 102 (L) 150 - 400 K/uL   nRBC 0.0 0.0 - 0.2 %   Neutrophils Relative % 72 %   Neutro Abs 5.4 1.7 - 7.7 K/uL   Lymphocytes Relative 18 %   Lymphs Abs 1.4 0.7 - 4.0 K/uL   Monocytes Relative 10 %   Monocytes Absolute 0.8 0.1 - 1.0 K/uL   Eosinophils Relative 0 %   Eosinophils Absolute 0.0 0.0 - 0.5 K/uL   Basophils Relative 0 %   Basophils Absolute 0.0 0.0 - 0.1 K/uL   Immature Granulocytes 0 %   Abs Immature Granulocytes 0.03 0.00 - 0.07 K/uL  Magnesium     Status: None   Collection Time: 03/18/22  4:38 AM  Result Value Ref Range   Magnesium 1.8 1.7 - 2.4 mg/dL  Phosphorus     Status: Abnormal   Collection Time: 03/18/22  4:38 AM  Result Value Ref Range   Phosphorus 2.2 (L) 2.5 - 4.6 mg/dL  ABO/Rh  Status: None   Collection Time: 03/18/22  4:38 AM  Result Value Ref Range   ABO/RH(D)      O POS Performed at [facility] [address], [place], [place] [address]   Type and screen [place]     Status: None (Preliminary result)   Collection Time: 03/18/22  8:09 AM  Result Value Ref Range   ABO/RH(D) O POS    Antibody Screen NEG    Sample Expiration 03/21/2022,2359    Unit Number [id number]    Blood Component Type RED CELLS,LR    Unit division 00    Status of Unit ISSUED    Transfusion Status OK TO TRANSFUSE    Crossmatch Result      Compatible Performed at [facility], [place] [address], [place], [place] [address]   Prepare RBC (crossmatch)     Status: None   Collection Time: 03/18/22  8:09 AM  Result Value Ref Range   Order Confirmation      ORDER PROCESSED BY BLOOD BANK Performed at [facility], [place] [address] [place], [place] [address]     ASSESSMENT: Letcher Schweikert is a 88 y.o. male, 2 Days Post-Op s/p INTRAMEDULLARY NAIL LEFT INTERTROCHANTERIC FEMUR FRACTURE   CV/Blood loss: Hemoglobin 7.3 this morning.  1 unit PRBCs ordered per primary team.  BP soft  PLAN: Weightbearing: WBAT LLE ROM: Okay for  unrestricted hip and knee motion as tolerated Incisional and dressing care: Dressings removed today.  Okay to leave incisions open to air Showering: Okay to begin showering and getting incisions wet 03/19/2022 Orthopedic device(s): None  Pain management:  1. Tylenol 650 mg q 6 hours PRN 2. Robaxin 500 mg 3 times daily 3. Oxycodone 5 mg q 4 hours PRN 4. Fentanyl 25 mcg q 2 hours PRN VTE prophylaxis: Lovenox, SCDs ID:  Ancef 2gm post op completed Foley/Lines:  No foley, KVO IVFs Impediments to Fracture Healing: Vitamin D level low normal at 30.  Start on supplementation.    Dispo: Therapies as tolerated, PT/OT currently recommending SNF.  Continue to monitor CBC following transfusion today.  Discharge Rx for pain control, aspirin, vitamin D been signed and placed in patient's chart.  D/C recommendations: - Oxycodone for pain control -Aspirin for DVT prophylaxis -Possible need for Vit D supplementation  Follow - up plan: 2 weeks after discharge for wound check and repeat x-rays   Contact information:  Katha Hamming MD, Rushie Nyhan PA-C. After hours and holidays please check [email or web address] for group call information for [facility]   Gwinda Passe, PA-C [phone] (office) [email or web address]

## 2022-03-18 NOTE — TOC Progression Note (Addendum)
Transition of Care [facility]) - Progression Note    Patient Details  Name: Jimmy Russell MRN: [id number] Date of Birth: 1920/06/23  [facility]) CM/SW Contact  Joanne Chars, LCSW Phone Number: 03/18/2022, 2:34 PM  Clinical Narrative:   Bed offers presented to son.  [facility] unable to offer.  Choice document given.  They are requesting a response from [facility].   1440: [facility] does offer a bed, son informed and accepts.  Expected Discharge Plan: [place] Barriers to Discharge: Continued Medical Work up, SNF Pending bed offer  Expected Discharge Plan and Services Expected Discharge Plan: [place] In-house Referral: Clinical Social Work   Post Acute Care Choice: [place] Living arrangements for the past 2 months: Single Family Home                                       Social Determinants of Health (SDOH) Interventions    Readmission Risk Interventions     No data to display

## 2022-03-18 NOTE — Progress Notes (Signed)
Initial Nutrition Assessment  DOCUMENTATION CODES:   Severe malnutrition in context of chronic illness  INTERVENTION:  Continue regular diet as ordered Will order chopped meats for ease of chewing Ensure Enlive po BID, each supplement provides 350 kcal and 20 grams of protein. MVI with minerals daily  NUTRITION DIAGNOSIS:   Severe Malnutrition related to chronic illness as evidenced by severe fat depletion, severe muscle depletion.  GOAL:   Patient will meet greater than or equal to 90% of their needs  MONITOR:   PO intake, Supplement acceptance, Labs, Weight trends, Skin  REASON FOR ASSESSMENT:   Malnutrition Screening Tool    ASSESSMENT:   Pt presented to [facility] with L hip fx. Transferred to [facility] for surgical repair. PMH significant for thrombocytopenia, CLL with Richter transformation-in remission, prior valve surgery with bovine replacement.   8/14: S/p IMN of L hip.   Pt sleeping soundly at time of visit. No family present at bedside.   Noted ST evaluation yesterday. S/p bedside swallow eval as he was having difficulty swallowing after surgery. Pt able to self feed. ST recommend regular diet, thin liquids. No further follow up required.   Spoke with RN outside room. She states that he has been more sleepy today. He has difficulty with his dentures. She notes that they wiggle in his mouth. Observed breakfast tray with untouched omelette. RN states that she brought him an Ensure which was still on his table with maybe 1-2 sips taken.    Meal completions: 8/13: 30%- dinner 8/15: 100%-breakfast, 0%- lunch 8/16: 0%-lunch, 75%-dinner  Unfortunately there is limited documentation of weight history on file to review. Current weight is 60.3 kg. Last documented weight was 78.9 kg in 2019. Uncertain how recent this weight loss has occurred.   Medications: ferrous sulfate, MVI, miralax, senna  Labs: BUN 27, phos 2.2, alkaline phosphatase 35, AST 62  NUTRITION - FOCUSED  PHYSICAL EXAM:  Flowsheet Row Most Recent Value  Orbital Region Severe depletion  Upper Arm Region Severe depletion  Thoracic and Lumbar Region Severe depletion  Buccal Region Severe depletion  Temple Region Severe depletion  Clavicle Bone Region Severe depletion  Clavicle and Acromion Bone Region Severe depletion  Scapular Bone Region Severe depletion  Dorsal Hand Severe depletion  Patellar Region Severe depletion  Anterior Thigh Region Severe depletion  Posterior Calf Region Severe depletion  Edema (RD Assessment) None  Hair Reviewed  Eyes Reviewed  Mouth Other (Comment)  [edentulous]  Skin Reviewed  Nails Reviewed       Diet Order:   Diet Order             Diet regular Room service appropriate? Yes; Fluid consistency: Thin  Diet effective now                   EDUCATION NEEDS:   No education needs have been identified at this time  Skin:  Skin Assessment: Skin Integrity Issues: Skin Integrity Issues:: Incisions Incisions: L hip (closed)  Last BM:  8/15  Height:   Ht Readings from Last 1 Encounters:  03/16/22 '5\' 7"'$  (1.702 m)    Weight:   Wt Readings from Last 1 Encounters:  03/16/22 60.3 kg   BMI:  Body mass index is 20.83 kg/m.  Estimated Nutritional Needs:   Kcal:  1600-1800  Protein:  80-95g  Fluid:  >/=1.6L  Clayborne Dana, RDN, LDN Clinical Nutrition

## 2022-03-19 DIAGNOSIS — E43 Unspecified severe protein-calorie malnutrition: Secondary | ICD-10-CM | POA: Insufficient documentation

## 2022-03-19 DIAGNOSIS — S72002A Fracture of unspecified part of neck of left femur, initial encounter for closed fracture: Secondary | ICD-10-CM | POA: Diagnosis not present

## 2022-03-19 LAB — BPAM RBC
Blood Product Expiration Date: 202309152359
ISSUE DATE / TIME: 202308161101
Unit Type and Rh: 5100

## 2022-03-19 LAB — CBC
HCT: 23.9 % — ABNORMAL LOW (ref 39.0–52.0)
Hemoglobin: 8.4 g/dL — ABNORMAL LOW (ref 13.0–17.0)
MCH: 31.6 pg (ref 26.0–34.0)
MCHC: 35.1 g/dL (ref 30.0–36.0)
MCV: 89.8 fL (ref 80.0–100.0)
Platelets: 143 10*3/uL — ABNORMAL LOW (ref 150–400)
RBC: 2.66 MIL/uL — ABNORMAL LOW (ref 4.22–5.81)
RDW: 15.1 % (ref 11.5–15.5)
WBC: 7.7 10*3/uL (ref 4.0–10.5)
nRBC: 0 % (ref 0.0–0.2)

## 2022-03-19 LAB — TYPE AND SCREEN
ABO/RH(D): O POS
Antibody Screen: NEGATIVE
Unit division: 0

## 2022-03-19 MED ORDER — ORAL CARE MOUTH RINSE: 15.0000 mL | OROMUCOSAL | Status: DC | PRN

## 2022-03-19 MED ORDER — K PHOS MONO-SOD PHOS DI & MONO 155-852-130 MG PO TABS: 250.0000 mg | ORAL_TABLET | Freq: Three times a day (TID) | ORAL | 0 refills | Status: AC

## 2022-03-19 MED ORDER — K PHOS MONO-SOD PHOS DI & MONO 155-852-130 MG PO TABS
250.0000 mg | ORAL_TABLET | Freq: Three times a day (TID) | ORAL | Status: DC
  Administered 2022-03-19: 250 mg via ORAL
  Filled 2022-03-19: qty 1

## 2022-03-19 MED ORDER — FERROUS SULFATE 325 (65 FE) MG PO TABS: 325.0000 mg | ORAL_TABLET | ORAL | 3 refills | Status: DC

## 2022-03-19 NOTE — Plan of Care (Signed)

## 2022-03-19 NOTE — Discharge Summary (Signed)
Physician Discharge Summary  Jimmy Russell KGY:[id number] DOB: 1920/04/29 DOA: 03/15/2022  PCP: Celene Squibb, MD  Admit date: 03/15/2022  Discharge date: 03/19/2022  Admitted From: Home.  Disposition: Skilled nursing facility.  [facility]  Recommendations for Outpatient Follow-up:  Follow up with PCP in 1-2 weeks. Please obtain BMP/CBC in one week. Advised to follow-up with orthopedics Dr. Verlon Setting in 2 weeks. Advised to take aspirin 325 mg daily for 4 weeks for postoperative DVT prophylaxis. Advised to take oxycodone as needed for severe pain. Advised to continue vitamin D supplementation for vitamin D deficiency.  Home Health: None Equipment/Devices: None  Discharge Condition: Stable. CODE STATUS: Full code Diet recommendation: Heart Healthy.   Brief Summary/ Hospital Course: This 88 years old male with PMH significant for thrombocytopenia, CLL with Richter transformation-in remission, prior valve surgery with bovine replacement presented in the ED s/p mechanical fall at home with left hip pain after the fall.  X-ray confirms left femur intertrochanteric fracture. Orthopedics was consulted, Patient underwent ORIF (cephalomedullary nailing).  Postoperative day 3. Tolerated well, PT and OT recommended skilled nursing facility.  Insurance authorization approved.  Orthopedics signed off recommended patient continue aspirin 325 daily for 4 weeks for postoperative DVT prophylaxis.  And follow-up outpatient in 2 weeks with Dr. Verlon Setting for postoperative checkup.  Patient is doing fine and wants to be discharged.  Patient is being discharged to SNF.  Discharge Diagnoses:  Principal Problem:   Lt Hip fracture ([place]) Active Problems:   CLL (chronic lymphocytic leukemia) with Richter's Transformation -in Remission   Hypotension   Anemia   Thrombocytopenia (HCC)   Protein-calorie malnutrition, severe  Left femur fracture: Patient presented s/p mechanical fall at home with a left  intertrochanteric fracture with varus impaction. Orthopedic consulted,  Patient underwent cephalomedullary nailing of left intertrochanteric fracture. Postoperative day 3.  Adequate pain control with pain meds. Continue DVT prophylaxis as per Ortho.  Bowel regimen. PT recommended skilled nursing facility for rehab.   Hypotension: > Improved. Blood pressure remained on the lower side. Patient was on phenylephrine postop for brief period.Marland Kitchen   He also received albumin infusion. Resume blood pressure medications.   CLL: Stable.  Outpatient follow-up   Acute blood loss anemia: Hemoglobin dropped postoperatively.10.6 > 7.3 Transfuse 1 unit PRBC, follow-up posttransfusion H&H H&H remains stable afterwards.   Thrombocytopenia: Follow closely.  Discharge Instructions  Discharge Instructions     Call MD for:  persistant dizziness or light-headedness   Complete by: As directed    Call MD for:  persistant nausea and vomiting   Complete by: As directed    Call MD for:  severe uncontrolled pain   Complete by: As directed    Diet - low sodium heart healthy   Complete by: As directed    Diet Carb Modified   Complete by: As directed    Discharge instructions   Complete by: As directed    Advised to follow-up with primary care physician in 1 week. Advised to follow-up with orthopedics Dr. Verlon Setting in 2 weeks. Advised to take aspirin 325 mg daily for 4 weeks for postoperative DVT prophylaxis. Advised to take oxycodone as needed for severe pain. Advised to continue vitamin D supplementation for vitamin D deficiency.   Discharge wound care:   Complete by: As directed    Follow-up orthopedics in 2 weeks.   Increase activity slowly   Complete by: As directed       Allergies as of 03/19/2022   No Known Allergies  Medication List     TAKE these medications    acetaminophen 325 MG tablet Commonly known as: TYLENOL Take 2 tablets (650 mg total) by mouth every 6 (six) hours as  needed for mild pain, moderate pain or headache (or Fever >/= 101).   aspirin EC 325 MG tablet Take 1 tablet (325 mg total) by mouth daily. Swallow whole. What changed:  medication strength how much to take additional instructions   CENTRUM SILVER ADULT 50+ PO Take 1 tablet by mouth daily.   Clear Eyes Complete Soln Apply 1 drop to eye every morning.   Ensure Take 237 mLs by mouth daily.   ferrous sulfate 325 (65 FE) MG tablet Take 1 tablet (325 mg total) by mouth every other day. Start taking on: March 21, 2022   oxyCODONE 5 MG immediate release tablet Commonly known as: Oxy IR/ROXICODONE Take 1 tablet (5 mg total) by mouth every 4 (four) hours as needed for moderate pain.   phosphorus 155-852-130 MG tablet Commonly known as: K PHOS NEUTRAL Take 1 tablet (250 mg total) by mouth 3 (three) times daily for 3 days.   Vitamin D (Cholecalciferol) 25 MCG (1000 UT) Tabs Take 1 tablet by mouth daily.               Discharge Care Instructions  (From admission, onward)           Start     Ordered   03/19/22 0000  Discharge wound care:       Comments: Follow-up orthopedics in 2 weeks.   03/19/22 1009            Contact information for follow-up providers     Celene Squibb, MD Follow up in 1 week(s).   Specialty: Internal Medicine Contact information: [facility] [address] [phone]         Haddix, Thomasene Lot, MD Follow up.   Specialty: Orthopedic Surgery Contact information: [place] [address] [phone]              Contact information for after-discharge care     Destination     [facility] .   Service: Skilled Nursing Contact information: [address] [place] [phone]                    No Known Allergies  Consultations: Orthopedics   Procedures/Studies: DG Pelvis 1-2 Views  Result  Date: 03/16/2022 CLINICAL DATA:  Postop EXAM: PELVIS - 1-2 VIEW; LEFT FEMUR 2 VIEWS COMPARISON:  No radiographs 03/15/2022 FINDINGS: Interval postoperative change of intramedullary rod and screw fixation across a comminuted left intertrochanteric femur fracture. Improved alignment. No new fracture. Subcutaneous emphysema. IMPRESSION: Expected postoperative changes of IM rod and screw fixation across a comminuted left intertrochanteric femur fracture. Electronically Signed   By: Placido Sou M.D.   On: 03/16/2022 18:12   DG FEMUR MIN 2 VIEWS LEFT  Result Date: 03/16/2022 CLINICAL DATA:  Postop EXAM: PELVIS - 1-2 VIEW; LEFT FEMUR 2 VIEWS COMPARISON:  No radiographs 03/15/2022 FINDINGS: Interval postoperative change of intramedullary rod and screw fixation across a comminuted left intertrochanteric femur fracture. Improved alignment. No new fracture. Subcutaneous emphysema. IMPRESSION: Expected postoperative changes of IM rod and screw fixation across a comminuted left intertrochanteric femur fracture. Electronically Signed   By: Placido Sou M.D.   On: 03/16/2022 18:12   DG FEMUR MIN 2 VIEWS LEFT  Result Date: 03/16/2022 CLINICAL DATA:  Left femoral intramedullary nail placement EXAM: LEFT FEMUR 2 VIEWS COMPARISON:  03/15/2022 FINDINGS: Eight fluoroscopic images are obtained during the performance of the procedure and are provided for interpretation only. Intramedullary rod with proximal dynamic and distal interlocking screws traverse the intertrochanteric left hip fracture seen previously. Alignment is near anatomic. Please refer to the operative report. Fluoroscopy time: 2 minutes 24 seconds, 16.78 mGy IMPRESSION: 1. ORIF left hip fracture as above. Electronically Signed   By: Randa Ngo M.D.   On: 03/16/2022 12:49   DG C-Arm 1-60 Min-No Report  Result Date: 03/16/2022 Fluoroscopy was utilized by the requesting physician.  No radiographic interpretation.   DG C-Arm 1-60 Min-No  Report  Result Date: 03/16/2022 Fluoroscopy was utilized by the requesting physician.  No radiographic interpretation.   DG CHEST PORT 1 VIEW  Result Date: 03/15/2022 CLINICAL DATA:  Preoperative study EXAM: PORTABLE CHEST 1 VIEW COMPARISON:  November 08, 2014 FINDINGS: The right hemidiaphragm remains elevated with apparent atelectasis in the right base, stable. Stable right calcified nodule. No pneumothorax. No focal infiltrate. No change in the cardiomediastinal silhouette. IMPRESSION: No acute interval change or abnormality. Electronically Signed   By: Dorise Bullion III M.D.   On: 03/15/2022 09:44   DG Hip Unilat With Pelvis 2-3 Views Left  Result Date: 03/15/2022 CLINICAL DATA:  88 year old male status post fall. Left leg deformity. EXAM: DG HIP (WITH OR WITHOUT PELVIS) 2-3V LEFT; LEFT FEMUR 1 VIEW COMPARISON:  None Available. FINDINGS: Comminuted left femur intertrochanteric fracture with mild varus impaction. Butterfly fragment of the lesser trochanter. Left femoral head remains normally located. No superimposed acute fracture of the pelvis identified. Right femoral head normally located. Left femoral shaft and distal left femur appear intact. Calcified left femoral artery atherosclerosis. Negative visible bowel gas pattern. IMPRESSION: Comminuted left femur intertrochanteric fracture with varus impaction. Electronically Signed   By: Genevie Ann M.D.   On: 03/15/2022 08:37   DG Femur 1V Left  Result Date: 03/15/2022 CLINICAL DATA:  88 year old male status post fall. Left leg deformity. EXAM: DG HIP (WITH OR WITHOUT PELVIS) 2-3V LEFT; LEFT FEMUR 1 VIEW COMPARISON:  None Available. FINDINGS: Comminuted left femur intertrochanteric fracture with mild varus impaction. Butterfly fragment of the lesser trochanter. Left femoral head remains normally located. No superimposed acute fracture of the pelvis identified. Right femoral head normally located. Left femoral shaft and distal left femur appear intact.  Calcified left femoral artery atherosclerosis. Negative visible bowel gas pattern. IMPRESSION: Comminuted left femur intertrochanteric fracture with varus impaction. Electronically Signed   By: Genevie Ann M.D.   On: 03/15/2022 08:37    Subjective: Patient was seen and examined at bedside.  Overnight events noted.   Patient is s/p ORIF POD 3.  Patient participated in physical therapy approved for SNF.  Discharge Exam: Vitals:   03/19/22 0426 03/19/22 0731  BP: 129/84 134/61  Pulse: 66 67  Resp: 19   Temp: 98 F (36.7 C) 98 F (36.7 C)  SpO2:  97%   Vitals:   03/18/22 2026 03/19/22 0025 03/19/22 0426 03/19/22 0731  BP: (!) 104/54 115/60 129/84 134/61  Pulse: 66 61 66 67  Resp: '17 15 19   '$ Temp: 97.9 F (36.6 C) 98 F (36.7 C) 98 F (36.7 C) 98 F (36.7 C)  TempSrc: Oral Oral Oral Oral  SpO2: 97% 98%  97%  Weight:      Height:        General: Pt is alert,  awake, not in acute distress Cardiovascular: RRR, S1/S2 +, no rubs, no gallops Respiratory: CTA bilaterally, no wheezing, no rhonchi Abdominal: Soft, NT, ND, bowel sounds + Extremities: Left ORIF.  Participated in physical therapy.    The results of significant diagnostics from this hospitalization (including imaging, microbiology, ancillary and laboratory) are listed below for reference.     Microbiology: Recent Results (from the past 240 hour(s))  Surgical pcr screen     Status: None   Collection Time: 03/15/22 10:10 PM   Specimen: Nasal Mucosa; Nasal Swab  Result Value Ref Range Status   MRSA, PCR NEGATIVE NEGATIVE Final   Staphylococcus aureus NEGATIVE NEGATIVE Final    Comment: (NOTE) The Xpert SA Assay (FDA approved for NASAL specimens in patients 36 years of age and older), is one component of a comprehensive surveillance program. It is not intended to diagnose infection nor to guide or monitor treatment. Performed at [facility], [place] [address], [place], [place] [address]      Labs: BNP (last 3  results) No results for input(s): "BNP" in the last 8760 hours. Basic Metabolic Panel: Recent Labs  Lab 03/15/22 0734 03/17/22 0855 03/18/22 0438  NA 136 135 135  K 3.6 3.9 4.0  CL 102 102 102  CO2 '28 24 27  '$ GLUCOSE 142* 121* 115*  BUN 19 21 27*  CREATININE 0.70 0.81 0.74  CALCIUM 8.6* 8.4* 8.3*  MG  --   --  1.8  PHOS  --   --  2.2*   Liver Function Tests: Recent Labs  Lab 03/18/22 0438  AST 62*  ALT 18  ALKPHOS 35*  BILITOT 1.6*  PROT 4.5*  ALBUMIN 2.7*   No results for input(s): "LIPASE", "AMYLASE" in the last 168 hours. No results for input(s): "AMMONIA" in the last 168 hours. CBC: Recent Labs  Lab 03/15/22 0734 03/16/22 0302 03/16/22 1528 03/17/22 0855 03/17/22 1619 03/18/22 0438 03/18/22 1822 03/19/22 0150  WBC 9.8 9.3 8.2 7.9  --  7.7  --  7.7  NEUTROABS 8.4*  --   --   --   --  5.4  --   --   HGB 12.6* 10.8* 8.7* 7.3* 7.2* 7.3* 8.3* 8.4*  HCT 36.9* 31.1* 27.0* 21.2* 20.4* 21.4* 23.9* 23.9*  MCV 94.4 93.4 101.1* 95.5  --  94.3  --  89.8  PLT 140* 124* 83* 91*  --  102*  --  143*   Cardiac Enzymes: No results for input(s): "CKTOTAL", "CKMB", "CKMBINDEX", "TROPONINI" in the last 168 hours. BNP: Invalid input(s): "POCBNP" CBG: No results for input(s): "GLUCAP" in the last 168 hours. D-Dimer No results for input(s): "DDIMER" in the last 72 hours. Hgb A1c No results for input(s): "HGBA1C" in the last 72 hours. Lipid Profile No results for input(s): "CHOL", "HDL", "LDLCALC", "TRIG", "CHOLHDL", "LDLDIRECT" in the last 72 hours. Thyroid function studies No results for input(s): "TSH", "T4TOTAL", "T3FREE", "THYROIDAB" in the last 72 hours.  Invalid input(s): "FREET3" Anemia work up No results for input(s): "VITAMINB12", "FOLATE", "FERRITIN", "TIBC", "IRON", "RETICCTPCT" in the last 72 hours. Urinalysis    Component Value Date/Time   COLORURINE YELLOW 11/08/2014 1645   APPEARANCEUR CLEAR 11/08/2014 1645   LABSPEC 1.015 11/08/2014 1645   PHURINE  6.0 11/08/2014 [place] 11/08/2014 1645   HGBUR NEGATIVE 11/08/2014 [place] 11/08/2014 [place] 11/08/2014 1645   PROTEINUR NEGATIVE 11/08/2014 1645   UROBILINOGEN 0.2 11/08/2014 1645   NITRITE NEGATIVE  11/08/2014 [place] 11/08/2014 1645   Sepsis Labs Recent Labs  Lab 03/16/22 1528 03/17/22 0855 03/18/22 0438 03/19/22 0150  WBC 8.2 7.9 7.7 7.7   Microbiology Recent Results (from the past 240 hour(s))  Surgical pcr screen     Status: None   Collection Time: 03/15/22 10:10 PM   Specimen: Nasal Mucosa; Nasal Swab  Result Value Ref Range Status   MRSA, PCR NEGATIVE NEGATIVE Final   Staphylococcus aureus NEGATIVE NEGATIVE Final    Comment: (NOTE) The Xpert SA Assay (FDA approved for NASAL specimens in patients 23 years of age and older), is one component of a comprehensive surveillance program. It is not intended to diagnose infection nor to guide or monitor treatment. Performed at [facility], [place] [address] [place], [place] [address]      Time coordinating discharge: Over 30 minutes  SIGNED:   Shawna Clamp, MD  [facility] 03/19/2022, 11:49 AM Pager   If 7PM-7AM, please contact night-coverage

## 2022-03-19 NOTE — Discharge Instructions (Signed)
Advised to follow-up with primary care physician in 1 week. Advised to follow-up with orthopedics Dr. Verlon Setting in 2 weeks. Advised to take aspirin 325 mg daily for 4 weeks for postoperative DVT prophylaxis. Advised to take oxycodone as needed for severe pain. Advised to continue vitamin D supplementation for vitamin D deficiency.

## 2022-03-19 NOTE — Plan of Care (Signed)
Patient report called to Volney Presser RN at [facility]. Patient belongings, AVS with RX sent with patient via EMS. Problem: Education: Goal: Knowledge of General Education information will improve Description: Including pain rating scale, medication(s)/side effects and non-pharmacologic comfort measures Outcome: Adequate for Discharge   Problem: Health Behavior/Discharge Planning: Goal: Ability to manage health-related needs will improve Outcome: Adequate for Discharge   Problem: Clinical Measurements: Goal: Ability to maintain clinical measurements within normal limits will improve Outcome: Adequate for Discharge Goal: Will remain free from infection Outcome: Adequate for Discharge Goal: Diagnostic test results will improve Outcome: Adequate for Discharge Goal: Respiratory complications will improve Outcome: Adequate for Discharge Goal: Cardiovascular complication will be avoided Outcome: Adequate for Discharge   Problem: Activity: Goal: Risk for activity intolerance will decrease Outcome: Adequate for Discharge   Problem: Nutrition: Goal: Adequate nutrition will be maintained Outcome: Adequate for Discharge   Problem: Coping: Goal: Level of anxiety will decrease Outcome: Adequate for Discharge   Problem: Elimination: Goal: Will not experience complications related to bowel motility Outcome: Adequate for Discharge Goal: Will not experience complications related to urinary retention Outcome: Adequate for Discharge   Problem: Pain Managment: Goal: General experience of comfort will improve Outcome: Adequate for Discharge   Problem: Safety: Goal: Ability to remain free from injury will improve Outcome: Adequate for Discharge   Problem: Skin Integrity: Goal: Risk for impaired skin integrity will decrease Outcome: Adequate for Discharge

## 2022-03-19 NOTE — TOC Transition Note (Signed)
Transition of Care [facility]) - CM/SW Discharge Note   Patient Details  Name: Jimmy Russell MRN: [id number] Date of Birth: 26-Nov-1919  Transition of Care [facility] CM/SW Contact:  Joanne Chars, LCSW Phone Number: 03/19/2022, 12:03 PM   Clinical Narrative:   Pt discharging to [facility].  RN call report to [phone].     Final next level of care: Skilled Nursing Facility Barriers to Discharge: Barriers Resolved   Patient Goals and CMS Choice Patient states their goals for this hospitalization and ongoing recovery are:: get home   Choice offered to / list presented to : Adult Children (son [name])  Discharge Placement              Patient [facility] bed at:  [facility]) Patient to be transferred to facility by: [place] Name of family member notified: son [name] Patient and family notified of of transfer: 03/19/22  Discharge Plan and Services In-house Referral: Clinical Social Work   Post Acute Care Choice: [place]                               Social Determinants of Health (SDOH) Interventions     Readmission Risk Interventions     No data to display

## 2022-04-03 DEATH — deceased
